# Patient Record
Sex: Female | Born: 1935 | Race: Black or African American | Hispanic: No | Marital: Single | Smoking: Former smoker
Health system: Southern US, Community
[De-identification: ages and names within clinical notes are randomized; demographics above are authoritative.]

## PROBLEM LIST (undated history)

## (undated) DIAGNOSIS — E785 Hyperlipidemia, unspecified: Secondary | ICD-10-CM

## (undated) DIAGNOSIS — Z9889 Other specified postprocedural states: Secondary | ICD-10-CM

## (undated) DIAGNOSIS — R609 Edema, unspecified: Secondary | ICD-10-CM

## (undated) DIAGNOSIS — M129 Arthropathy, unspecified: Secondary | ICD-10-CM

## (undated) DIAGNOSIS — D72819 Decreased white blood cell count, unspecified: Secondary | ICD-10-CM

## (undated) DIAGNOSIS — E871 Hypo-osmolality and hyponatremia: Secondary | ICD-10-CM

## (undated) DIAGNOSIS — R809 Proteinuria, unspecified: Secondary | ICD-10-CM

## (undated) DIAGNOSIS — I10 Essential (primary) hypertension: Secondary | ICD-10-CM

## (undated) DIAGNOSIS — I639 Cerebral infarction, unspecified: Secondary | ICD-10-CM

## (undated) DIAGNOSIS — J45901 Unspecified asthma with (acute) exacerbation: Secondary | ICD-10-CM

## (undated) DIAGNOSIS — R413 Other amnesia: Secondary | ICD-10-CM

## (undated) HISTORY — DX: Decreased white blood cell count, unspecified: D72.819

## (undated) HISTORY — DX: Hyperlipidemia, unspecified: E78.5

## (undated) HISTORY — DX: Unspecified asthma with (acute) exacerbation: J45.901

## (undated) HISTORY — DX: Edema, unspecified: R60.9

## (undated) HISTORY — DX: Arthropathy, unspecified: M12.9

## (undated) HISTORY — PX: OTHER SURGICAL HISTORY: SHX169

## (undated) HISTORY — DX: Other specified postprocedural states: Z98.890

## (undated) HISTORY — DX: Proteinuria, unspecified: R80.9

## (undated) HISTORY — DX: Essential (primary) hypertension: I10

## (undated) HISTORY — DX: Cerebral infarction, unspecified: I63.9

## (undated) HISTORY — DX: Hypo-osmolality and hyponatremia: E87.1

## (undated) HISTORY — DX: Other amnesia: R41.3

---

## 2001-06-04 ENCOUNTER — Encounter (INDEPENDENT_AMBULATORY_CARE_PROVIDER_SITE_OTHER): Payer: Self-pay | Admitting: Internal Medicine

## 2001-06-04 ENCOUNTER — Ambulatory Visit (HOSPITAL_COMMUNITY): Admission: RE | Admit: 2001-06-04 | Discharge: 2001-06-04 | Payer: Self-pay | Admitting: Internal Medicine

## 2002-06-13 ENCOUNTER — Encounter (INDEPENDENT_AMBULATORY_CARE_PROVIDER_SITE_OTHER): Payer: Self-pay | Admitting: Internal Medicine

## 2002-06-13 ENCOUNTER — Ambulatory Visit (HOSPITAL_COMMUNITY): Admission: RE | Admit: 2002-06-13 | Discharge: 2002-06-13 | Payer: Self-pay | Admitting: Internal Medicine

## 2002-06-16 ENCOUNTER — Encounter (INDEPENDENT_AMBULATORY_CARE_PROVIDER_SITE_OTHER): Payer: Self-pay | Admitting: Internal Medicine

## 2002-06-16 ENCOUNTER — Ambulatory Visit (HOSPITAL_COMMUNITY): Admission: RE | Admit: 2002-06-16 | Discharge: 2002-06-16 | Payer: Self-pay | Admitting: Internal Medicine

## 2003-07-03 ENCOUNTER — Ambulatory Visit (HOSPITAL_COMMUNITY): Admission: RE | Admit: 2003-07-03 | Discharge: 2003-07-03 | Payer: Self-pay | Admitting: Internal Medicine

## 2003-07-03 ENCOUNTER — Encounter (INDEPENDENT_AMBULATORY_CARE_PROVIDER_SITE_OTHER): Payer: Self-pay | Admitting: Internal Medicine

## 2003-07-07 ENCOUNTER — Inpatient Hospital Stay (HOSPITAL_COMMUNITY): Admission: EM | Admit: 2003-07-07 | Discharge: 2003-07-07 | Payer: Self-pay | Admitting: Emergency Medicine

## 2004-07-04 ENCOUNTER — Ambulatory Visit (HOSPITAL_COMMUNITY): Admission: RE | Admit: 2004-07-04 | Discharge: 2004-07-04 | Payer: Self-pay | Admitting: Internal Medicine

## 2004-07-24 ENCOUNTER — Ambulatory Visit (HOSPITAL_COMMUNITY): Admission: RE | Admit: 2004-07-24 | Discharge: 2004-07-24 | Payer: Self-pay | Admitting: Internal Medicine

## 2004-10-24 ENCOUNTER — Ambulatory Visit: Payer: Self-pay | Admitting: Internal Medicine

## 2004-11-11 ENCOUNTER — Ambulatory Visit: Payer: Self-pay | Admitting: Internal Medicine

## 2004-11-27 ENCOUNTER — Ambulatory Visit: Payer: Self-pay | Admitting: Internal Medicine

## 2004-12-17 ENCOUNTER — Ambulatory Visit: Payer: Self-pay | Admitting: Internal Medicine

## 2004-12-23 ENCOUNTER — Ambulatory Visit (HOSPITAL_COMMUNITY): Admission: RE | Admit: 2004-12-23 | Discharge: 2004-12-23 | Payer: Self-pay | Admitting: Internal Medicine

## 2005-01-06 ENCOUNTER — Ambulatory Visit: Payer: Self-pay | Admitting: Internal Medicine

## 2005-02-17 ENCOUNTER — Ambulatory Visit: Payer: Self-pay | Admitting: Internal Medicine

## 2005-06-11 ENCOUNTER — Ambulatory Visit: Payer: Self-pay | Admitting: Internal Medicine

## 2005-07-15 ENCOUNTER — Ambulatory Visit (HOSPITAL_COMMUNITY): Admission: RE | Admit: 2005-07-15 | Discharge: 2005-07-15 | Payer: Self-pay | Admitting: Internal Medicine

## 2005-08-01 ENCOUNTER — Ambulatory Visit: Payer: Self-pay | Admitting: Internal Medicine

## 2005-08-13 ENCOUNTER — Ambulatory Visit: Payer: Self-pay | Admitting: *Deleted

## 2005-08-19 ENCOUNTER — Ambulatory Visit: Payer: Self-pay | Admitting: *Deleted

## 2005-08-19 ENCOUNTER — Encounter (HOSPITAL_COMMUNITY): Admission: RE | Admit: 2005-08-19 | Discharge: 2005-09-18 | Payer: Self-pay | Admitting: *Deleted

## 2005-08-22 ENCOUNTER — Ambulatory Visit: Payer: Self-pay | Admitting: *Deleted

## 2005-09-03 ENCOUNTER — Ambulatory Visit: Payer: Self-pay | Admitting: *Deleted

## 2005-09-09 ENCOUNTER — Ambulatory Visit (HOSPITAL_COMMUNITY): Admission: RE | Admit: 2005-09-09 | Discharge: 2005-09-09 | Payer: Self-pay | Admitting: *Deleted

## 2005-10-08 ENCOUNTER — Ambulatory Visit: Payer: Self-pay | Admitting: *Deleted

## 2005-10-13 ENCOUNTER — Ambulatory Visit: Payer: Self-pay | Admitting: Internal Medicine

## 2006-02-08 ENCOUNTER — Emergency Department (HOSPITAL_COMMUNITY): Admission: EM | Admit: 2006-02-08 | Discharge: 2006-02-08 | Payer: Self-pay | Admitting: Emergency Medicine

## 2006-02-17 ENCOUNTER — Ambulatory Visit: Payer: Self-pay | Admitting: Internal Medicine

## 2006-04-21 ENCOUNTER — Ambulatory Visit: Payer: Self-pay | Admitting: Internal Medicine

## 2006-05-21 ENCOUNTER — Ambulatory Visit: Payer: Self-pay | Admitting: Internal Medicine

## 2006-07-16 ENCOUNTER — Ambulatory Visit (HOSPITAL_COMMUNITY): Admission: RE | Admit: 2006-07-16 | Discharge: 2006-07-16 | Payer: Self-pay | Admitting: Internal Medicine

## 2006-07-31 ENCOUNTER — Ambulatory Visit: Payer: Self-pay | Admitting: Internal Medicine

## 2006-12-09 ENCOUNTER — Ambulatory Visit: Payer: Self-pay | Admitting: Internal Medicine

## 2007-02-19 ENCOUNTER — Ambulatory Visit: Payer: Self-pay | Admitting: Internal Medicine

## 2007-05-11 ENCOUNTER — Ambulatory Visit: Payer: Self-pay | Admitting: Gastroenterology

## 2007-06-14 ENCOUNTER — Ambulatory Visit: Payer: Self-pay | Admitting: Family Medicine

## 2007-06-14 DIAGNOSIS — J45901 Unspecified asthma with (acute) exacerbation: Secondary | ICD-10-CM | POA: Insufficient documentation

## 2007-06-14 DIAGNOSIS — R609 Edema, unspecified: Secondary | ICD-10-CM

## 2007-06-14 DIAGNOSIS — R809 Proteinuria, unspecified: Secondary | ICD-10-CM | POA: Insufficient documentation

## 2007-06-14 DIAGNOSIS — I10 Essential (primary) hypertension: Secondary | ICD-10-CM

## 2007-06-14 DIAGNOSIS — M129 Arthropathy, unspecified: Secondary | ICD-10-CM | POA: Insufficient documentation

## 2007-06-16 ENCOUNTER — Encounter (INDEPENDENT_AMBULATORY_CARE_PROVIDER_SITE_OTHER): Payer: Self-pay | Admitting: Family Medicine

## 2007-06-18 LAB — CONVERTED CEMR LAB
AST: 30 units/L (ref 0–37)
Albumin: 4.4 g/dL (ref 3.5–5.2)
Alkaline Phosphatase: 114 units/L (ref 39–117)
Basophils Relative: 0 % (ref 0–1)
Eosinophils Absolute: 0 10*3/uL (ref 0.0–0.7)
Eosinophils Relative: 1 % (ref 0–5)
Glucose, Bld: 99 mg/dL (ref 70–99)
HDL: 76 mg/dL (ref >39)
Hemoglobin: 14.2 g/dL (ref 12.0–15.0)
LDL Cholesterol: 166 mg/dL — ABNORMAL HIGH (ref 0–99)
Lymphs Abs: 1.4 10*3/uL (ref 0.7–3.3)
Neutro Abs: 1.8 10*3/uL (ref 1.7–7.7)
Potassium: 3.8 meq/L (ref 3.5–5.3)
RBC: 4.43 M/uL (ref 3.87–5.11)
Sodium: 128 meq/L — ABNORMAL LOW (ref 135–145)
TSH: 1.574 microintl units/mL (ref 0.350–5.50)
Total Bilirubin: 0.9 mg/dL (ref 0.3–1.2)
Total Protein: 7.6 g/dL (ref 6.0–8.3)
Triglycerides: 95 mg/dL (ref <150)
VLDL: 19 mg/dL (ref 0–40)

## 2007-07-12 ENCOUNTER — Ambulatory Visit: Payer: Self-pay | Admitting: Family Medicine

## 2007-07-12 DIAGNOSIS — E785 Hyperlipidemia, unspecified: Secondary | ICD-10-CM

## 2007-07-12 LAB — CONVERTED CEMR LAB: LDL Goal: 160 mg/dL

## 2007-07-19 ENCOUNTER — Encounter (INDEPENDENT_AMBULATORY_CARE_PROVIDER_SITE_OTHER): Payer: Self-pay | Admitting: Family Medicine

## 2007-07-19 ENCOUNTER — Ambulatory Visit (HOSPITAL_COMMUNITY): Admission: RE | Admit: 2007-07-19 | Discharge: 2007-07-19 | Payer: Self-pay | Admitting: Gastroenterology

## 2007-07-27 ENCOUNTER — Encounter (INDEPENDENT_AMBULATORY_CARE_PROVIDER_SITE_OTHER): Payer: Self-pay | Admitting: Family Medicine

## 2007-07-28 ENCOUNTER — Ambulatory Visit (HOSPITAL_COMMUNITY): Admission: RE | Admit: 2007-07-28 | Discharge: 2007-07-28 | Payer: Self-pay | Admitting: Family Medicine

## 2007-07-29 ENCOUNTER — Telehealth (INDEPENDENT_AMBULATORY_CARE_PROVIDER_SITE_OTHER): Payer: Self-pay | Admitting: *Deleted

## 2007-08-09 ENCOUNTER — Ambulatory Visit: Payer: Self-pay | Admitting: Family Medicine

## 2007-08-09 ENCOUNTER — Telehealth (INDEPENDENT_AMBULATORY_CARE_PROVIDER_SITE_OTHER): Payer: Self-pay | Admitting: *Deleted

## 2007-08-09 DIAGNOSIS — D72819 Decreased white blood cell count, unspecified: Secondary | ICD-10-CM | POA: Insufficient documentation

## 2007-08-12 ENCOUNTER — Encounter (INDEPENDENT_AMBULATORY_CARE_PROVIDER_SITE_OTHER): Payer: Self-pay | Admitting: Family Medicine

## 2007-08-24 ENCOUNTER — Encounter (INDEPENDENT_AMBULATORY_CARE_PROVIDER_SITE_OTHER): Payer: Self-pay | Admitting: Family Medicine

## 2007-09-15 ENCOUNTER — Ambulatory Visit: Payer: Self-pay | Admitting: Family Medicine

## 2007-09-28 ENCOUNTER — Encounter (INDEPENDENT_AMBULATORY_CARE_PROVIDER_SITE_OTHER): Payer: Self-pay | Admitting: Family Medicine

## 2007-10-14 ENCOUNTER — Encounter (INDEPENDENT_AMBULATORY_CARE_PROVIDER_SITE_OTHER): Payer: Self-pay | Admitting: Family Medicine

## 2007-10-18 ENCOUNTER — Encounter (INDEPENDENT_AMBULATORY_CARE_PROVIDER_SITE_OTHER): Payer: Self-pay | Admitting: Family Medicine

## 2007-10-19 LAB — CONVERTED CEMR LAB
Alkaline Phosphatase: 103 units/L (ref 39–117)
CO2: 25 meq/L (ref 19–32)
Cholesterol: 241 mg/dL — ABNORMAL HIGH (ref 0–200)
Creatinine, Ser: 0.77 mg/dL (ref 0.40–1.20)
Glucose, Bld: 107 mg/dL — ABNORMAL HIGH (ref 70–99)
HCT: 40.7 % (ref 36.0–46.0)
HDL: 78 mg/dL (ref >39)
MCHC: 34.4 g/dL (ref 30.0–36.0)
MCV: 98.5 fL (ref 78.0–100.0)
RBC: 4.13 M/uL (ref 3.87–5.11)
Total Bilirubin: 0.9 mg/dL (ref 0.3–1.2)
Total CHOL/HDL Ratio: 3.1
Triglycerides: 98 mg/dL (ref <150)
VLDL: 20 mg/dL (ref 0–40)
WBC: 4.6 10*3/uL (ref 4.0–10.5)

## 2007-10-20 ENCOUNTER — Telehealth (INDEPENDENT_AMBULATORY_CARE_PROVIDER_SITE_OTHER): Payer: Self-pay | Admitting: *Deleted

## 2007-11-25 ENCOUNTER — Telehealth (INDEPENDENT_AMBULATORY_CARE_PROVIDER_SITE_OTHER): Payer: Self-pay | Admitting: Family Medicine

## 2007-11-26 ENCOUNTER — Ambulatory Visit: Payer: Self-pay | Admitting: Family Medicine

## 2007-11-26 ENCOUNTER — Telehealth (INDEPENDENT_AMBULATORY_CARE_PROVIDER_SITE_OTHER): Payer: Self-pay | Admitting: *Deleted

## 2007-11-26 DIAGNOSIS — R413 Other amnesia: Secondary | ICD-10-CM

## 2007-12-02 ENCOUNTER — Ambulatory Visit (HOSPITAL_COMMUNITY): Admission: RE | Admit: 2007-12-02 | Discharge: 2007-12-02 | Payer: Self-pay | Admitting: Family Medicine

## 2007-12-03 ENCOUNTER — Encounter (INDEPENDENT_AMBULATORY_CARE_PROVIDER_SITE_OTHER): Payer: Self-pay | Admitting: Family Medicine

## 2007-12-14 ENCOUNTER — Ambulatory Visit: Payer: Self-pay | Admitting: Family Medicine

## 2007-12-14 DIAGNOSIS — J301 Allergic rhinitis due to pollen: Secondary | ICD-10-CM

## 2007-12-28 ENCOUNTER — Ambulatory Visit: Payer: Self-pay | Admitting: Family Medicine

## 2007-12-28 DIAGNOSIS — M810 Age-related osteoporosis without current pathological fracture: Secondary | ICD-10-CM | POA: Insufficient documentation

## 2008-01-26 ENCOUNTER — Ambulatory Visit: Payer: Self-pay | Admitting: Family Medicine

## 2008-01-27 ENCOUNTER — Ambulatory Visit (HOSPITAL_COMMUNITY): Admission: RE | Admit: 2008-01-27 | Discharge: 2008-01-27 | Payer: Self-pay | Admitting: Family Medicine

## 2008-01-28 ENCOUNTER — Telehealth (INDEPENDENT_AMBULATORY_CARE_PROVIDER_SITE_OTHER): Payer: Self-pay | Admitting: *Deleted

## 2008-02-01 ENCOUNTER — Ambulatory Visit: Payer: Self-pay | Admitting: Family Medicine

## 2008-02-01 ENCOUNTER — Telehealth (INDEPENDENT_AMBULATORY_CARE_PROVIDER_SITE_OTHER): Payer: Self-pay | Admitting: *Deleted

## 2008-02-01 DIAGNOSIS — M1710 Unilateral primary osteoarthritis, unspecified knee: Secondary | ICD-10-CM

## 2008-02-07 ENCOUNTER — Ambulatory Visit (HOSPITAL_COMMUNITY): Admission: RE | Admit: 2008-02-07 | Discharge: 2008-02-07 | Payer: Self-pay | Admitting: Family Medicine

## 2008-02-07 ENCOUNTER — Encounter (INDEPENDENT_AMBULATORY_CARE_PROVIDER_SITE_OTHER): Payer: Self-pay | Admitting: Family Medicine

## 2008-02-08 ENCOUNTER — Telehealth (INDEPENDENT_AMBULATORY_CARE_PROVIDER_SITE_OTHER): Payer: Self-pay | Admitting: *Deleted

## 2008-02-29 ENCOUNTER — Ambulatory Visit: Payer: Self-pay | Admitting: Family Medicine

## 2008-02-29 LAB — CONVERTED CEMR LAB
Nitrite: NEGATIVE
Protein, U semiquant: >=300
Urobilinogen, UA: 0.2
pH: 7.5

## 2008-05-31 ENCOUNTER — Ambulatory Visit: Payer: Self-pay | Admitting: Family Medicine

## 2008-06-01 LAB — CONVERTED CEMR LAB
BUN: 17 mg/dL (ref 6–23)
Calcium: 9.3 mg/dL (ref 8.4–10.5)
Eosinophils Absolute: 0 10*3/uL (ref 0.0–0.7)
Eosinophils Relative: 0 % (ref 0–5)
Glucose, Bld: 84 mg/dL (ref 70–99)
HCT: 38.5 % (ref 36.0–46.0)
Lymphs Abs: 1.2 10*3/uL (ref 0.7–4.0)
MCHC: 33.8 g/dL (ref 30.0–36.0)
MCV: 98.7 fL (ref 78.0–100.0)
Platelets: 172 10*3/uL (ref 150–400)
RDW: 12.9 % (ref 11.5–15.5)
Sodium: 133 meq/L — ABNORMAL LOW (ref 135–145)
WBC: 3.7 10*3/uL — ABNORMAL LOW (ref 4.0–10.5)

## 2008-07-12 ENCOUNTER — Ambulatory Visit: Payer: Self-pay | Admitting: Family Medicine

## 2008-07-19 ENCOUNTER — Ambulatory Visit (HOSPITAL_COMMUNITY): Admission: RE | Admit: 2008-07-19 | Discharge: 2008-07-19 | Payer: Self-pay | Admitting: Family Medicine

## 2008-08-09 ENCOUNTER — Ambulatory Visit: Payer: Self-pay | Admitting: Family Medicine

## 2008-08-15 ENCOUNTER — Encounter (INDEPENDENT_AMBULATORY_CARE_PROVIDER_SITE_OTHER): Payer: Self-pay | Admitting: Family Medicine

## 2008-08-24 ENCOUNTER — Ambulatory Visit: Payer: Self-pay | Admitting: Family Medicine

## 2008-08-24 LAB — CONVERTED CEMR LAB

## 2008-11-08 ENCOUNTER — Ambulatory Visit: Payer: Self-pay | Admitting: Family Medicine

## 2008-11-08 DIAGNOSIS — L97909 Non-pressure chronic ulcer of unspecified part of unspecified lower leg with unspecified severity: Secondary | ICD-10-CM | POA: Insufficient documentation

## 2008-11-09 ENCOUNTER — Encounter (INDEPENDENT_AMBULATORY_CARE_PROVIDER_SITE_OTHER): Payer: Self-pay | Admitting: Family Medicine

## 2008-11-16 ENCOUNTER — Encounter (INDEPENDENT_AMBULATORY_CARE_PROVIDER_SITE_OTHER): Payer: Self-pay | Admitting: Family Medicine

## 2008-11-21 ENCOUNTER — Ambulatory Visit: Payer: Self-pay | Admitting: Family Medicine

## 2008-11-22 ENCOUNTER — Encounter (INDEPENDENT_AMBULATORY_CARE_PROVIDER_SITE_OTHER): Payer: Self-pay | Admitting: Family Medicine

## 2008-11-24 LAB — CONVERTED CEMR LAB
ALT: 17 U/L
AST: 29 U/L
Albumin: 4.1 g/dL
Alkaline Phosphatase: 123 U/L — ABNORMAL HIGH
BUN: 15 mg/dL
Basophils Absolute: 0 K/uL
Basophils Relative: 0 %
CO2: 23 meq/L
Calcium: 9.7 mg/dL
Chloride: 100 meq/L
Cholesterol: 238 mg/dL — ABNORMAL HIGH
Creatinine, Ser: 0.55 mg/dL
Eosinophils Absolute: 0 K/uL
Eosinophils Relative: 0 %
Glucose, Bld: 107 mg/dL — ABNORMAL HIGH
HCT: 40.6 %
HDL: 72 mg/dL
Hemoglobin: 13.2 g/dL
LDL Cholesterol: 141 mg/dL — ABNORMAL HIGH
Lymphocytes Relative: 38 %
Lymphs Abs: 2 K/uL
MCHC: 32.5 g/dL
MCV: 100.5 fL — ABNORMAL HIGH
Monocytes Absolute: 0.4 K/uL
Monocytes Relative: 7 %
Neutro Abs: 2.8 K/uL
Neutrophils Relative %: 54 %
Platelets: 233 K/uL
Potassium: 4.1 meq/L
RBC: 4.04 M/uL
RDW: 13.2 %
Sodium: 137 meq/L
TSH: 0.936 u[IU]/mL
Total Bilirubin: 0.7 mg/dL
Total CHOL/HDL Ratio: 3.3
Total Protein: 7.2 g/dL
Triglycerides: 125 mg/dL
VLDL: 25 mg/dL
WBC: 5.2 10*3/microliter

## 2008-12-05 ENCOUNTER — Ambulatory Visit: Payer: Self-pay | Admitting: Family Medicine

## 2008-12-28 ENCOUNTER — Encounter (INDEPENDENT_AMBULATORY_CARE_PROVIDER_SITE_OTHER): Payer: Self-pay | Admitting: Family Medicine

## 2009-01-16 ENCOUNTER — Ambulatory Visit: Payer: Self-pay | Admitting: Family Medicine

## 2009-01-17 LAB — CONVERTED CEMR LAB
Albumin: 3.8 g/dL (ref 3.5–5.2)
CO2: 26 meq/L (ref 19–32)
Glucose, Bld: 86 mg/dL (ref 70–99)
Potassium: 4.1 meq/L (ref 3.5–5.3)
Sodium: 136 meq/L (ref 135–145)
Total Protein: 7 g/dL (ref 6.0–8.3)

## 2009-01-25 ENCOUNTER — Encounter (INDEPENDENT_AMBULATORY_CARE_PROVIDER_SITE_OTHER): Payer: Self-pay | Admitting: Family Medicine

## 2009-02-01 ENCOUNTER — Encounter (INDEPENDENT_AMBULATORY_CARE_PROVIDER_SITE_OTHER): Payer: Self-pay | Admitting: Family Medicine

## 2009-02-27 ENCOUNTER — Ambulatory Visit: Payer: Self-pay | Admitting: Family Medicine

## 2009-02-27 LAB — CONVERTED CEMR LAB
Bilirubin Urine: NEGATIVE
Glucose, Urine, Semiquant: NEGATIVE
Specific Gravity, Urine: 1.015
pH: 7

## 2009-03-02 ENCOUNTER — Encounter (INDEPENDENT_AMBULATORY_CARE_PROVIDER_SITE_OTHER): Payer: Self-pay | Admitting: *Deleted

## 2009-03-08 LAB — CONVERTED CEMR LAB
ALT: 13 units/L (ref 0–35)
Alkaline Phosphatase: 129 units/L — ABNORMAL HIGH (ref 39–117)
LDL Cholesterol: 107 mg/dL — ABNORMAL HIGH (ref 0–99)
Sodium: 139 meq/L (ref 135–145)
Total Bilirubin: 0.6 mg/dL (ref 0.3–1.2)
Total Protein: 7.2 g/dL (ref 6.0–8.3)
Triglycerides: 82 mg/dL (ref <150)
VLDL: 16 mg/dL (ref 0–40)

## 2009-03-14 ENCOUNTER — Encounter (INDEPENDENT_AMBULATORY_CARE_PROVIDER_SITE_OTHER): Payer: Self-pay | Admitting: Family Medicine

## 2009-03-26 ENCOUNTER — Ambulatory Visit: Payer: Self-pay | Admitting: Family Medicine

## 2009-03-29 ENCOUNTER — Encounter (INDEPENDENT_AMBULATORY_CARE_PROVIDER_SITE_OTHER): Payer: Self-pay | Admitting: Family Medicine

## 2009-05-07 ENCOUNTER — Ambulatory Visit: Payer: Self-pay | Admitting: Family Medicine

## 2009-05-08 ENCOUNTER — Encounter (INDEPENDENT_AMBULATORY_CARE_PROVIDER_SITE_OTHER): Payer: Self-pay | Admitting: Family Medicine

## 2009-05-08 LAB — CONVERTED CEMR LAB
Bilirubin, Direct: 0.1 mg/dL (ref 0.0–0.3)
Indirect Bilirubin: 0.5 mg/dL (ref 0.0–0.9)
Total Protein: 6.8 g/dL (ref 6.0–8.3)

## 2009-06-19 ENCOUNTER — Ambulatory Visit: Payer: Self-pay | Admitting: Family Medicine

## 2009-06-19 DIAGNOSIS — R740 Nonspecific elevation of levels of transaminase and lactic acid dehydrogenase [LDH]: Secondary | ICD-10-CM

## 2009-06-21 LAB — CONVERTED CEMR LAB
AST: 32 units/L (ref 0–37)
Albumin: 4 g/dL (ref 3.5–5.2)
BUN: 12 mg/dL (ref 6–23)
Calcium: 9.3 mg/dL (ref 8.4–10.5)
Chloride: 102 meq/L (ref 96–112)
HDL: 67 mg/dL (ref >39)
Potassium: 3.6 meq/L (ref 3.5–5.3)
Total Protein: 7.1 g/dL (ref 6.0–8.3)

## 2009-07-20 ENCOUNTER — Ambulatory Visit (HOSPITAL_COMMUNITY): Admission: RE | Admit: 2009-07-20 | Discharge: 2009-07-20 | Payer: Self-pay | Admitting: Family Medicine

## 2009-08-13 ENCOUNTER — Inpatient Hospital Stay (HOSPITAL_COMMUNITY): Admission: EM | Admit: 2009-08-13 | Discharge: 2009-08-20 | Payer: Self-pay | Admitting: Emergency Medicine

## 2009-08-16 ENCOUNTER — Ambulatory Visit: Payer: Self-pay | Admitting: Internal Medicine

## 2009-09-18 DIAGNOSIS — J45909 Unspecified asthma, uncomplicated: Secondary | ICD-10-CM | POA: Insufficient documentation

## 2009-09-19 ENCOUNTER — Ambulatory Visit: Payer: Self-pay | Admitting: Gastroenterology

## 2009-09-19 DIAGNOSIS — Z8639 Personal history of other endocrine, nutritional and metabolic disease: Secondary | ICD-10-CM

## 2009-09-19 DIAGNOSIS — Z862 Personal history of diseases of the blood and blood-forming organs and certain disorders involving the immune mechanism: Secondary | ICD-10-CM

## 2009-10-17 LAB — CONVERTED CEMR LAB
ALT: 11 units/L (ref 0–35)
AST: 20 units/L (ref 0–37)
Albumin: 3.8 g/dL (ref 3.5–5.2)
Alkaline Phosphatase: 133 units/L — ABNORMAL HIGH (ref 39–117)
Bilirubin, Direct: 0.2 mg/dL (ref 0.0–0.3)
Indirect Bilirubin: 0.6 mg/dL (ref 0.0–0.9)
Total Bilirubin: 0.8 mg/dL (ref 0.3–1.2)
Total Protein: 7 g/dL (ref 6.0–8.3)

## 2009-12-04 ENCOUNTER — Ambulatory Visit: Payer: Self-pay | Admitting: Gastroenterology

## 2010-01-22 ENCOUNTER — Ambulatory Visit (HOSPITAL_COMMUNITY): Admission: EM | Admit: 2010-01-22 | Discharge: 2010-01-22 | Payer: Self-pay | Admitting: Emergency Medicine

## 2010-02-21 ENCOUNTER — Encounter: Payer: Self-pay | Admitting: Orthopedic Surgery

## 2010-04-03 ENCOUNTER — Ambulatory Visit: Payer: Self-pay | Admitting: Orthopedic Surgery

## 2010-04-03 DIAGNOSIS — M479 Spondylosis, unspecified: Secondary | ICD-10-CM | POA: Insufficient documentation

## 2010-04-03 DIAGNOSIS — M549 Dorsalgia, unspecified: Secondary | ICD-10-CM | POA: Insufficient documentation

## 2010-04-11 ENCOUNTER — Encounter (HOSPITAL_COMMUNITY): Admission: RE | Admit: 2010-04-11 | Discharge: 2010-05-11 | Payer: Self-pay | Admitting: Orthopedic Surgery

## 2010-05-08 ENCOUNTER — Encounter: Payer: Self-pay | Admitting: Orthopedic Surgery

## 2010-05-13 ENCOUNTER — Encounter: Payer: Self-pay | Admitting: Orthopedic Surgery

## 2010-07-23 ENCOUNTER — Ambulatory Visit (HOSPITAL_COMMUNITY): Admission: RE | Admit: 2010-07-23 | Discharge: 2010-07-23 | Payer: Self-pay | Admitting: Internal Medicine

## 2010-08-20 ENCOUNTER — Ambulatory Visit: Payer: Self-pay | Admitting: Gastroenterology

## 2010-08-22 ENCOUNTER — Telehealth (INDEPENDENT_AMBULATORY_CARE_PROVIDER_SITE_OTHER): Payer: Self-pay | Admitting: *Deleted

## 2010-09-09 ENCOUNTER — Ambulatory Visit (HOSPITAL_COMMUNITY): Admission: RE | Admit: 2010-09-09 | Payer: Self-pay | Admitting: Gastroenterology

## 2010-09-17 ENCOUNTER — Ambulatory Visit (HOSPITAL_COMMUNITY)
Admission: RE | Admit: 2010-09-17 | Discharge: 2010-09-17 | Payer: Self-pay | Source: Home / Self Care | Attending: Ophthalmology | Admitting: Ophthalmology

## 2010-10-06 DIAGNOSIS — Z9889 Other specified postprocedural states: Secondary | ICD-10-CM

## 2010-10-06 HISTORY — DX: Other specified postprocedural states: Z98.890

## 2010-10-15 ENCOUNTER — Encounter: Payer: Self-pay | Admitting: Internal Medicine

## 2010-10-27 ENCOUNTER — Encounter: Payer: Self-pay | Admitting: Family Medicine

## 2010-10-27 ENCOUNTER — Encounter: Payer: Self-pay | Admitting: Internal Medicine

## 2010-10-29 ENCOUNTER — Ambulatory Visit (HOSPITAL_COMMUNITY)
Admission: RE | Admit: 2010-10-29 | Discharge: 2010-10-29 | Payer: Self-pay | Source: Home / Self Care | Attending: Ophthalmology | Admitting: Ophthalmology

## 2010-11-03 LAB — CONVERTED CEMR LAB
Blood in Urine, dipstick: NEGATIVE
Ketones, urine, test strip: NEGATIVE
LDL Goal: 100 mg/dL
Urobilinogen, UA: 0.2
WBC Urine, dipstick: NEGATIVE

## 2010-11-05 ENCOUNTER — Ambulatory Visit (HOSPITAL_COMMUNITY)
Admission: RE | Admit: 2010-11-05 | Discharge: 2010-11-05 | Payer: Self-pay | Source: Home / Self Care | Attending: Internal Medicine | Admitting: Internal Medicine

## 2010-11-05 HISTORY — PX: COLONOSCOPY: SHX174

## 2010-11-05 NOTE — Assessment & Plan Note (Signed)
Summary: RT HIP PAIN/HAD XRAY [facility]/REF [facility],[employer] [facility]   Vital Signs:  Patient profile:   77 year old female Height:      65 inches Weight:      131 pounds Pulse rate:   68 / minute Resp:     16 per minute  Vitals Entered By: Fuller Canada MD (April 03, 2010 9:11 AM)  Visit Type:  New patient Referring Provider:  Dr. Felecia Shelling Primary Provider:  Felecia Shelling, M.D.  CC:  right hip pain.  History of Present Illness: I saw [name] in the office today for an initial visit.  She is a 77 years old woman with the complaint of:  right hip pain.    She complains of a throbbing pain, which is 7/10, and it comes and goes to curves morning in 19 months worse with walking, better with sitting, associated with numbness and tingling somewhat relieved by Tylenol. Pain came on about 4 weeks ago. It radiates down to the foot. She has some RIGHT leg weakness and some numbness in the RIGHT foot pain starts in the lower back across the buttocks down the thigh and into the leg and foot.    Xrays right hip and pelvis 01/22/10. review of these x-rays show that there is very mild arthritis in the RIGHT hip. There is no moderate arthritis as noted on x-ray.  Meds: ASA, Tramadol, Amlodipine, Lisinopril, Ramipril.    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  Review of Systems Constitutional:  Denies weight loss, weight gain, fever, chills, and fatigue. Cardiovascular:  Denies chest pain, palpitations, fainting, and murmurs. Respiratory:  Denies  short of breath, wheezing, couch, tightness, pain on inspiration, and snoring . Gastrointestinal:  Denies heartburn, nausea, vomiting, diarrhea, constipation, and blood in your stools. Genitourinary:  Denies frequency, urgency, difficulty urinating, painful urination, flank pain, and bleeding in urine. Neurologic:  Denies numbness, tingling, unsteady gait, dizziness, tremors, and seizure. Musculoskeletal:  Complains of joint pain and stiffness; denies swelling, instability, redness, heat, and muscle pain. Endocrine:  Denies excessive thirst, exessive urination, and heat or cold intolerance. Psychiatric:  Denies nervousness, depression, anxiety, and hallucinations. Skin:  Denies changes in the skin, poor healing, rash, itching, and redness. HEENT:  Denies blurred or double vision, eye pain, redness, and watering. Immunology:  Denies seasonal allergies, sinus problems, and allergic to bee stings. Hemoatologic:  Denies easy bleeding and brusing.  Physical Exam  Additional Exam:  Constitutional: vital signs see recorded values. General: normal development, nutrition, and grooming. No deformity. Body Habitus is ectomorphic CDV: Observation and palpation was normal   Lymph: palpation of the lymph nodes were normal  Skin: inspection and palpation of the skin revealed Fungal infections in the nails of the toes. Skin otherwise normal. Neuro: coordination: normal              DTR's normal              Sensation was normal   Psyche: Alert and oriented x 3. Mood was normal.  Affect: normal  MSK: Gait: normal ,  Posture is abnormal.  She does not have any groin pain with internal rotation of the RIGHT or LEFT hip. There is no hip flexion contracture. She has normal leg lengths. Normal muscle tone and strength in her lower extremities.  Both hips and knees are stable.  Just some tenderness in her lower back has a RIGHT leg. Straight leg raise abnormal. LEFT straight leg raise  normal.     Impression & Recommendations:  Problem # 1:  BACK PAIN (ICD-724.5) Assessment New  3 views of the lumbar spine show a obliquity in the pelvis with a LEFT apex. Lumbar curve. There is a slight increase in the lumbar lordosis and there is increased arthritic change at the facet joint of L4 and 5 with subluxation anteriorly of L4 on 5 grade 1. Disc spaces remain open. L3-L4 and 5.  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 Most likely in the lumbar region.  Recommend physical therapy, continue tramadol and added gabapentin 100 mg t.i.d. okay to titrate up to 3 tablets t.i.d., but I don't expect. We will need to have the dose. Patient is referred back to her primary care doctor for further treatment, no surgical treatment necessary at this plan. Patient does not do well with therapy, and gabapentin, including increased dosing. I would recommend MRI and epidural injection at L4-L5.  Her updated medication list for this problem includes:    Adult Aspirin Ec Low Strength 81 Mg Tbec (Aspirin) .Marland Kitchen... 2 once daily    Tylenol Extra Strength 500 Mg Tabs (Acetaminophen) .Marland Kitchen..Marland Kitchen Two three times a day as needed  Orders: New Patient Level III (29562) Lumbosacral Spine ,2/3 views (72100)  Problem # 2:  SPONDYLOSIS (ICD-721.90) Assessment: New  Orders: New Patient Level III (13086) Lumbosacral Spine ,2/3 views (72100)  Medications Added to Medication List This Visit: 1)  Neurontin 100 Mg Caps (Gabapentin) .Marland Kitchen.. 1 by mouth three times a day  Patient Instructions: 1)  Most patients (90%) of patients with low back pain will improve with time (2-6 weeks). Limit activity to comfort and avoid activities that increase discomfort.  Apply moist heat and/or ice to lower back and take medication as instructed for pain relief. Please read the Back Pain Handout and start Physical Therapy as directed.  2)  continue Tramadol   3)  add neurontin for radiating pain  4)  Please schedule a follow-up appointment as needed. Prescriptions: NEURONTIN 100 MG CAPS (GABAPENTIN) 1 by mouth three times a day  #90 x 5   Entered and Authorized by:   Fuller Canada MD   Signed by:   Fuller Canada MD on 04/03/2010   Method used:   Print then Give to Patient   RxID:   [id number]

## 2010-11-05 NOTE — Miscellaneous (Signed)
Summary: PT clinical evaluation  PT clinical evaluation   Imported By: Jacklynn Ganong 05/08/2010 13:22:46  _____________________________________________________________________  External Attachment:    Type:   Image     Comment:   External Document

## 2010-11-05 NOTE — Miscellaneous (Signed)
Summary: pr order  Clinical Lists Changes  Orders: Added new Referral order of Physical Therapy Referral (PT) - Signed

## 2010-11-05 NOTE — Letter (Signed)
Summary: *Orthopedic Consult Note  [facility]  [address] [place] [address]  [place], [place] [address]   Phone: [phone]  Fax: [phone]    Re:    [name] DOB:    08-06-1922   Dear:    Danae Chen you for requesting that we see the above patient for referral.  A copy of the detailed office note will be sent under separate cover, for your review.  Evaluation today is consistent with:  1)  SPONDYLOSIS (ICD-721.90) 2)  BACK PAIN (ICD-724.5)   Our recommendation is for: physical therapy. Medical management with tramadol and gabapentin. Please see enclosed note       Thank you for this opportunity to look after your patient.  Sincerely,   Terrance Mass. MD.

## 2010-11-05 NOTE — Miscellaneous (Signed)
Summary: PT progress note  PT progress note   Imported By: Jacklynn Ganong 05/14/2010 16:30:58  _____________________________________________________________________  External Attachment:    Type:   Image     Comment:   External Document  Appended Document: ELEVATED LIVE ENZYMES, COLON CANCER SCREENING REMINDER FOR 12 MON OPV IS IN THE COMPUTER  Appended Document: Orders Update    Clinical Lists Changes  Orders: Added new Service order of Est. Patient Level III ([address]) - Signed

## 2010-11-05 NOTE — Assessment & Plan Note (Signed)
Summary: ELEVATED LIVER ENZYNES-alk phos   Visit Type:  Follow-up Visit Primary Care Provider:  Felecia Shelling, M.D.  Chief Complaint:  F/U elevated LFT"s.  History of Present Illness: Pt has had an elevated alk phos (1000)  since 1982. Workup included attempted ERCP & TH Cholangiography. ESR/AMA NEG.  LIVER Bx: portal inflammation. 1996: repeat LIVER Bx-nonspecific portal changes, copper stains neg. Transaminases are normal except with infection.  In 2002, weight 157 lbs, alk phos 153, ast 26, alt 14. 2003: On and off Lipitor-CPK elevated, 359, alk phos 181, aldolase 12.5. off Lipitor: aldolase and ast/alt returned to nl, alk phos 128. 2004 tried on Zetia. zetia d/c ? Lipitor restarted 2006. 2007: alk phos 153, ast 34, alt 23. Pt admitted 2010 with PNA on Lipitor 40 mg qd. LIVER ENZYMES ELEVATED: T BILI 2.6, D BILI 1.2, ALK PHOS 214, AST 34, ALT 36, ALBUMIN 1.9.   Arthritis is bothering her. Sees Dr. Felecia Shelling on MON. No yellow eyes or abd pain. Appetite is good.  Current Medications (verified): 1)  Adult Aspirin Ec Low Strength 81 Mg  Tbec (Aspirin) .... 2 Once Daily 2)  Amlodipine Besylate 10 Mg  Tabs (Amlodipine Besylate) .... One Daily 3)  Altace 10 Mg  Caps (Ramipril) .... Once Daily 4)  Bystolic 20 Mg Tabs (Nebivolol Hcl) .... One Daily 5)  Actonel 150 Mg Tabs (Risedronate Sodium) .... One Monthly 6)  Caltrate 600+d Plus 600-400 Mg-Unit  Tabs (Calcium Carbonate-Vit D-Min) .... Two Times A Day 7)  Tylenol Extra Strength 500 Mg Tabs (Acetaminophen) .... Two Three Times A Day As Needed 8)  Furosemide 40 Mg Tabs (Furosemide) .... Once Daily As Needed 9)  Pravachol 80 Mg Tabs (Pravastatin Sodium) .... One Daily 10)  Albuterol Sulfate (2.5 Mg/65ml) 0.083% Nebu (Albuterol Sulfate) .... Three Times A Day As Needed 11)  Multivitamins  Tabs (Multiple Vitamin) .... Once Daily 12)  Hydrochlorothiazide 12.5 Mg Caps (Hydrochlorothiazide) .... Once Daily 13)  Meclizine Hcl 25 Mg Tabs (Meclizine Hcl) .... Take  One Every 6 Hours As Needed 14)  Fish Oil  Oil (Fish Oil) .... Once Daily 15)  Klor-Con 20 Meq Pack (Potassium Chloride) .... 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  Vital Signs:  Patient profile:   77 year old female Height:      66 inches Weight:      140 pounds BSA:     1.72 Temp:     98.0 degrees F oral Pulse rate:   72 / minute BP sitting:   144 / 90  (left arm) Cuff size:   regular  Vitals Entered By: Cloria Spring LPN (December 05, 3242 9:47 AM)  Physical Exam  General:  Well developed, well nourished, no acute distress. Head:  Normocephalic and atraumatic. Lungs:  Clear throughout to auscultation. Heart:  Regular rate and rhythm; no murmurs Abdomen:  Soft, nontender and nondistended. Normal bowel sounds.  Impression & Recommendations:  Problem # 1:  LIVER FUNCTION TESTS, ABNORMAL, HX OF (ICD-V12.2) Assessment Improved  No S/Sx of cholestasis. OPV in 6 mos. Recheck  HFP. Avoid HMG CoA reductase inhibitors.  CC: PCP  Orders: Est. Patient Level II (04540)

## 2010-11-05 NOTE — Progress Notes (Signed)
  Phone Note Call from Patient   Reason for Call: Referral Summary of Call: Pt called to [facility] her TCS. She is having cataract surgery first and wants to wait until that is done first. You can reach her at [phone] Initial call taken by: Diana Eves,  August 22, 2010 1:12 PM     Appended Document:  I called pt,no answer.

## 2010-11-05 NOTE — Letter (Signed)
Summary: History form  History form   Imported By: Jacklynn Ganong 04/04/2010 15:04:31  _____________________________________________________________________  External Attachment:    Type:   Image     Comment:   External Document

## 2010-11-05 NOTE — Letter (Signed)
Summary: TCS ORDER  TCS ORDER   Imported By: Ave Filter 08/20/2010 09:47:22  _____________________________________________________________________  External Attachment:    Type:   Image     Comment:   External Document

## 2010-11-06 NOTE — Op Note (Signed)
  [name], [name]             ACCOUNT NO.:  [id number]  MEDICAL RECORD NO.:  [id number]          PATIENT TYPE:  AMB  LOCATION:  DAY                           FACILITY:  [facility]  PHYSICIAN:  R. Roetta Sessions, M.D. DATE OF BIRTH:  Aug 15, 1922  DATE OF PROCEDURE:  11/05/2010 DATE OF DISCHARGE:                              OPERATIVE REPORT   PROCEDURE:  Screening colonoscopy.  INDICATIONS FOR PROCEDURE:  An 77 year old lady who is followed by Dr. Felecia Shelling, primary, comes for her first ever screening colonoscopy.  She has no lower GI tract symptoms currently.  No family history of polyps or colon cancer.  Colonoscopy is now being done.  Risks, benefits, limitations, alternatives, and imponderables have been discussed, questions answered.  Please see the documentation in the medical record.  PROCEDURE NOTE:  O2 saturation, blood pressure, pulse, respirations were monitored throughout the entire procedure.  CONSCIOUS SEDATION:  Versed 4 mg IV, Demerol 50 mg IV in divided doses.  INSTRUMENT:  Pentax video chip system.  FINDINGS:  Digital rectal exam revealed moderate degree of anal stenosis which limits the digital examination.  Endoscopic findings:  Prep was good.  Colon:  Colonic mucosa was surveyed from the rectosigmoid junction through the left transverse right colon to the appendiceal orifice, ileocecal valve/cecum.  These structures were well seen and photographed for the record.  From this level, the scope was slowly and cautiously withdrawn.  All previously mentioned mucosal surfaces were again seen.  The patient at the clustering, had diffusely pigmented rectal colonic mucosa consistent with melanosis coli.  The patient had clustering of cecal diverticula.  Remainder of the colonic mucosa otherwise appeared normal.  Scope was pulled down to the rectum where a thorough examination of rectal mucosa including retroflexed view of the anal verge demonstrated some anal papilla and  internal hemorrhoids, some excoriation likely related to scope and enema-tip trauma.  The patient tolerated the procedure well.  Cecal withdrawal time 7 minutes.  IMPRESSION: 1. Anal papilla and internal hemorrhoids pigmented rectal mucosa     consistent with melanosis coli. 2. Diffuse pigmentation of the colonic mucosa consistent with     melanosis coli, localized cecal diverticula as described above.  RECOMMENDATIONS: 1. Patient is to follow up with Dr. Felecia Shelling, as scheduled. 2. No further GI evaluation warranted at this time.     Jonathon Bellows, M.D.     RMR/MEDQ  D:  11/05/2010  T:  11/05/2010  Job:  [id number]  cc:   Tesfaye D. Felecia Shelling, MD Fax: [phone]  Electronically Signed by Lorrin Goodell M.D. on 11/06/2010 02:22:42 PM

## 2010-11-07 NOTE — Letter (Signed)
Summary: TRIAGE ORDER  TRIAGE ORDER   Imported By: Ave Filter 10/15/2010 11:51:21  _____________________________________________________________________  External Attachment:    Type:   Image     Comment:   External Document  Appended Document: TRIAGE ORDER ok as is

## 2010-11-07 NOTE — Assessment & Plan Note (Signed)
Summary: ELEVATED LIVE ENZYMES, COLON CANCER SCREENING   Visit Type:  Follow-up Visit Primary Care Provider:  Felecia Shelling, M.D.  Chief Complaint:  follow up- doing good.  History of Present Illness: Doing good except her right leg hurts her. Seeing PT at [facility] and doing better than PT at [facility]. Lortab helps her pain. No nausea or vomiting. Lost 4 lbs since last visit. BM every AM. No abd pain. Appetite: good.   Current Medications (verified): 1)  Adult Aspirin Ec Low Strength 81 Mg  Tbec (Aspirin) .... 2 Once Daily 2)  Amlodipine Besylate 10 Mg  Tabs (Amlodipine Besylate) .... One Daily 3)  Altace 10 Mg  Caps (Ramipril) .... Once Daily 4)  Caltrate 600+d Plus 600-400 Mg-Unit  Tabs (Calcium Carbonate-Vit D-Min) .... Two Times A Day 5)  Albuterol Sulfate (2.5 Mg/34ml) 0.083% Nebu (Albuterol Sulfate) .... Three Times A Day As Needed 6)  Multivitamins  Tabs (Multiple Vitamin) .... Once Daily 7)  Fish Oil  Oil (Fish Oil) .... Once Daily 8)  Klor-Con 20 Meq Pack (Potassium Chloride) .... Once Daily 9)  Tramadol-Acetaminophen 37.5-325 Mg Tabs (Tramadol-Acetaminophen) .... Three Times A Day 10)  Lortab 5-500 Mg Tabs (Hydrocodone-Acetaminophen) .... Two Times A Day As Needed 11)  Lisinopril-Hydrochlorothiazide 20-12.5 Mg Tabs (Lisinopril-Hydrochlorothiazide) .... Two Times A Day  Allergies (verified): No Known Drug Allergies  Past History:  Past Medical History: Last updated: 12/14/2007 Current Problems:  MEMORY LOSS (ICD-780.93) LEUKOPENIA, MILD (ICD-288.50) HYPONATREMIA (ICD-276.1) HYPERLIPIDEMIA (ICD-272.4) PROTEINURIA (ICD-791.0) PERIPHERAL EDEMA (ICD-782.3) Hx of ASTHMA NOS W/ACUTE EXACERBATION (ICD-493.92) ARTHRITIS (ICD-716.90) HYPERTENSION (ICD-401.9)  Past Surgical History: Last updated: 08/09/2008 No hx of surgery.  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  Social History: Reviewed history from 04/03/2010 and no changes required. Single, no children Never Smoked Alcohol use-no Drug use-no no caffeine Retired Clinical research associate alone Highest level of education - high school and one year at [employer] in [place].  Vital Signs:  Patient profile:   77 year old female Height:      65 inches Weight:      136 pounds BMI:     22.71 Temp:     97.9 degrees F oral Pulse rate:   80 / minute BP sitting:   132 / 78  (left arm) Cuff size:   regular  Vitals Entered By: Hendricks Limes LPN (August 20, 2010 9:01 AM)  Physical Exam  General:  Well developed, well nourished, no acute distress. Head:  Normocephalic and atraumatic. Eyes:  PERRL, no icterus. Lungs:  Clear throughout to auscultation. Heart:  Regular rate and rhythm; no murmurs. Abdomen:  Soft, nontender and nondistended. Normal bowel sounds. Extremities:  No edema noted. Neurologic:  Alert and  oriented x4;  grossly normal neurologically.  Impression & Recommendations:  Problem # 1:  LIVER FUNCTION TESTS, ABNORMAL, HX OF (ICD-V12.2) Assessment Improved  CHECK HFP. OPV IN 12 MOS.  Orders: T-Hepatic Function [phone])  Problem # 2:  SCREENING, COLON CANCER (ICD-V76.51) Assessment: New  Never had a TCS. Scheduled in 2008 but did not have it done. TCS NOV 2011-SUPREP.  CC: PCP  Orders: T-Hepatic Function [phone])  Appended Document: ELEVATED LIVE ENZYMES, COLON CANCER SCREENING REMINDER FOR 12 MON OPV IS IN THE COMPUTER  Appended Document: Orders Update    Clinical Lists Changes  Orders: Added new Service order of Est.  Patient Level III ([address]) - Signed

## 2010-12-16 LAB — POCT I-STAT 4, (NA,K, GLUC, HGB,HCT): Sodium: 130 mEq/L — ABNORMAL LOW (ref 135–145)

## 2011-01-08 LAB — DIFFERENTIAL
Basophils Absolute: 0 10*3/uL (ref 0.0–0.1)
Basophils Absolute: 0 10*3/uL (ref 0.0–0.1)
Basophils Absolute: 0 10*3/uL (ref 0.0–0.1)
Basophils Absolute: 0 10*3/uL (ref 0.0–0.1)
Basophils Absolute: 0 10*3/uL (ref 0.0–0.1)
Basophils Relative: 0 % (ref 0–1)
Basophils Relative: 0 % (ref 0–1)
Eosinophils Absolute: 0 10*3/uL (ref 0.0–0.7)
Eosinophils Absolute: 0 10*3/uL (ref 0.0–0.7)
Eosinophils Relative: 0 % (ref 0–5)
Eosinophils Relative: 0 % (ref 0–5)
Eosinophils Relative: 1 % (ref 0–5)
Lymphocytes Relative: 2 % — ABNORMAL LOW (ref 12–46)
Lymphocytes Relative: 5 % — ABNORMAL LOW (ref 12–46)
Lymphocytes Relative: 5 % — ABNORMAL LOW (ref 12–46)
Lymphocytes Relative: 6 % — ABNORMAL LOW (ref 12–46)
Lymphs Abs: 0.4 10*3/uL — ABNORMAL LOW (ref 0.7–4.0)
Lymphs Abs: 1 10*3/uL (ref 0.7–4.0)
Lymphs Abs: 1.1 10*3/uL (ref 0.7–4.0)
Monocytes Absolute: 1 10*3/uL (ref 0.1–1.0)
Monocytes Absolute: 1 10*3/uL (ref 0.1–1.0)
Monocytes Relative: 4 % (ref 3–12)
Neutro Abs: 16.2 10*3/uL — ABNORMAL HIGH (ref 1.7–7.7)
Neutro Abs: 18 10*3/uL — ABNORMAL HIGH (ref 1.7–7.7)
Neutro Abs: 18.8 10*3/uL — ABNORMAL HIGH (ref 1.7–7.7)
Neutro Abs: 20.5 10*3/uL — ABNORMAL HIGH (ref 1.7–7.7)
WBC Morphology: INCREASED
WBC Morphology: INCREASED

## 2011-01-08 LAB — HEPATIC FUNCTION PANEL
ALT: 24 U/L (ref 0–35)
ALT: 38 U/L — ABNORMAL HIGH (ref 0–35)
AST: 28 U/L (ref 0–37)
AST: 39 U/L — ABNORMAL HIGH (ref 0–37)
AST: 52 U/L — ABNORMAL HIGH (ref 0–37)
Albumin: 1.9 g/dL — ABNORMAL LOW (ref 3.5–5.2)
Albumin: 1.9 g/dL — ABNORMAL LOW (ref 3.5–5.2)
Alkaline Phosphatase: 214 U/L — ABNORMAL HIGH (ref 39–117)
Bilirubin, Direct: 1.2 mg/dL — ABNORMAL HIGH (ref 0.0–0.3)
Bilirubin, Direct: 1.3 mg/dL — ABNORMAL HIGH (ref 0.0–0.3)
Indirect Bilirubin: 0.8 mg/dL (ref 0.3–0.9)
Indirect Bilirubin: 1.4 mg/dL — ABNORMAL HIGH (ref 0.3–0.9)
Total Bilirubin: 2.1 mg/dL — ABNORMAL HIGH (ref 0.3–1.2)
Total Protein: 5.7 g/dL — ABNORMAL LOW (ref 6.0–8.3)
Total Protein: 6.1 g/dL (ref 6.0–8.3)
Total Protein: 6.2 g/dL (ref 6.0–8.3)

## 2011-01-08 LAB — URINALYSIS, MICROSCOPIC ONLY
Ketones, ur: NEGATIVE mg/dL
Leukocytes, UA: NEGATIVE
Nitrite: NEGATIVE
Protein, ur: 30 mg/dL — AB
Urobilinogen, UA: 0.2 mg/dL (ref 0.0–1.0)
pH: 6.5 (ref 5.0–8.0)

## 2011-01-08 LAB — CBC
HCT: 33.8 % — ABNORMAL LOW (ref 36.0–46.0)
HCT: 35.8 % — ABNORMAL LOW (ref 36.0–46.0)
HCT: 36.6 % (ref 36.0–46.0)
Hemoglobin: 12.5 g/dL (ref 12.0–15.0)
Hemoglobin: 12.6 g/dL (ref 12.0–15.0)
Hemoglobin: 13.4 g/dL (ref 12.0–15.0)
MCHC: 34.3 g/dL (ref 30.0–36.0)
MCHC: 35.1 g/dL (ref 30.0–36.0)
MCV: 94.3 fL (ref 78.0–100.0)
MCV: 95.1 fL (ref 78.0–100.0)
Platelets: 123 10*3/uL — ABNORMAL LOW (ref 150–400)
Platelets: 169 10*3/uL (ref 150–400)
Platelets: 73 10*3/uL — ABNORMAL LOW (ref 150–400)
Platelets: 80 10*3/uL — ABNORMAL LOW (ref 150–400)
RBC: 4.07 MIL/uL (ref 3.87–5.11)
RDW: 14 % (ref 11.5–15.5)
RDW: 14.1 % (ref 11.5–15.5)
RDW: 14.2 % (ref 11.5–15.5)
RDW: 14.2 % (ref 11.5–15.5)
RDW: 14.2 % (ref 11.5–15.5)
RDW: 14.4 % (ref 11.5–15.5)
WBC: 17.3 10*3/uL — ABNORMAL HIGH (ref 4.0–10.5)
WBC: 21.1 10*3/uL — ABNORMAL HIGH (ref 4.0–10.5)

## 2011-01-08 LAB — BASIC METABOLIC PANEL
BUN: 26 mg/dL — ABNORMAL HIGH (ref 6–23)
BUN: 31 mg/dL — ABNORMAL HIGH (ref 6–23)
BUN: 41 mg/dL — ABNORMAL HIGH (ref 6–23)
BUN: 56 mg/dL — ABNORMAL HIGH (ref 6–23)
BUN: 63 mg/dL — ABNORMAL HIGH (ref 6–23)
CO2: 25 mEq/L (ref 19–32)
CO2: 25 mEq/L (ref 19–32)
Calcium: 10.3 mg/dL (ref 8.4–10.5)
Calcium: 9 mg/dL (ref 8.4–10.5)
Calcium: 9.7 mg/dL (ref 8.4–10.5)
Chloride: 102 mEq/L (ref 96–112)
Chloride: 99 mEq/L (ref 96–112)
Creatinine, Ser: 1.89 mg/dL — ABNORMAL HIGH (ref 0.4–1.2)
GFR calc non Af Amer: 25 mL/min — ABNORMAL LOW (ref >60)
GFR calc non Af Amer: 31 mL/min — ABNORMAL LOW (ref >60)
GFR calc non Af Amer: 41 mL/min — ABNORMAL LOW (ref >60)
GFR calc non Af Amer: 44 mL/min — ABNORMAL LOW (ref >60)
Glucose, Bld: 123 mg/dL — ABNORMAL HIGH (ref 70–99)
Glucose, Bld: 124 mg/dL — ABNORMAL HIGH (ref 70–99)
Glucose, Bld: 130 mg/dL — ABNORMAL HIGH (ref 70–99)
Glucose, Bld: 131 mg/dL — ABNORMAL HIGH (ref 70–99)
Glucose, Bld: 138 mg/dL — ABNORMAL HIGH (ref 70–99)
Potassium: 3.2 mEq/L — ABNORMAL LOW (ref 3.5–5.1)
Potassium: 3.3 mEq/L — ABNORMAL LOW (ref 3.5–5.1)
Potassium: 3.8 mEq/L (ref 3.5–5.1)
Sodium: 133 mEq/L — ABNORMAL LOW (ref 135–145)
Sodium: 133 mEq/L — ABNORMAL LOW (ref 135–145)
Sodium: 135 mEq/L (ref 135–145)

## 2011-01-08 LAB — POCT CARDIAC MARKERS
CKMB, poc: 6.5 ng/mL (ref 1.0–8.0)
Myoglobin, poc: >500 ng/mL (ref 12–200)
Troponin i, poc: <0.05 ng/mL (ref 0.00–0.09)

## 2011-01-08 LAB — CULTURE, BLOOD (ROUTINE X 2)

## 2011-01-08 LAB — COMPREHENSIVE METABOLIC PANEL
ALT: 29 U/L (ref 0–35)
AST: 38 U/L — ABNORMAL HIGH (ref 0–37)
Alkaline Phosphatase: 229 U/L — ABNORMAL HIGH (ref 39–117)
CO2: 26 mEq/L (ref 19–32)
GFR calc Af Amer: 28 mL/min — ABNORMAL LOW (ref >60)
Glucose, Bld: 107 mg/dL — ABNORMAL HIGH (ref 70–99)
Potassium: 3.1 mEq/L — ABNORMAL LOW (ref 3.5–5.1)
Sodium: 133 mEq/L — ABNORMAL LOW (ref 135–145)
Total Protein: 7 g/dL (ref 6.0–8.3)

## 2011-01-08 LAB — URINE MICROSCOPIC-ADD ON

## 2011-01-08 LAB — URINALYSIS, ROUTINE W REFLEX MICROSCOPIC
Nitrite: POSITIVE — AB
Specific Gravity, Urine: 1.02 (ref 1.005–1.030)
pH: 5.5 (ref 5.0–8.0)

## 2011-01-08 LAB — URINE CULTURE: Colony Count: >=100000

## 2011-02-18 NOTE — Assessment & Plan Note (Signed)
NAMEMarland [name]  DEMITRA, DANLEY              CHART#:  [id number]   DATE:  05/11/2007                       DOB:  [birth year]   PROBLEM LIST:  1. Hyperlipidemia.  2. Asthma.  3. Hypertension.  4. Lower extremity edema.   SUBJECTIVE:  [name] is an 77 year old female who is seen as a return  patient visit.  She is a new patient to me.  She was originally seen by  Dr. Karilyn Cota.  She was placed on Lipitor for hyperlipidemia and developed  elevated creatinine kinase.  She complains of swelling in her legs.  The  Lipitor has been stopped.  Her last lab draw in July 2008, her CK was  down to 278.  She denies any change in her bowel movements, blood in her  stool, or black, tarry stool.  She has no heartburn or indigestion.  She  complains that her legs swell on a daily basis.  After she goes to bed  and wakes up in the morning, it is down.  She walks 3 miles a day  without any chest pain or shortness of breath.  The swelling does not  prevent her from walking.  She has the swelling in her legs about every  day.   MEDICATIONS:  1. Albuterol inhaler 3-4 times a week.  2. Lasix once daily as needed.  3. Hydrochlorothiazide 12.5 mg daily.   OBJECTIVE:  VITAL SIGNS:  Weight 146 pounds (down 3 pounds since May  2008), height 5 feet 6 inches, temperature 97.8, blood pressure 160/88,  pulse 68.  GENERAL:  She is in no apparent distress.  Alert and oriented  x4.HEENT:  Atraumatic, normocephalic.  Pupils equal and reactive to  light.  Mouth:  No oral lesions.  Posterior oropharynx without erythema  or exudate.NECK:  Full range of motion.  No lymphadenopathy. LUNGS:  Clear to auscultation bilaterally.CARDIOVASCULAR:  Regular rhythm.  No  murmur.  Normal S1 and S2.ABDOMEN:  Bowel sounds are present, soft,  nontender, nondistended.  No rebound or guarding.EXTREMITIES:  Without  cyanosis, clubbing or edema. NEURO:  She has no focal neurologic  deficit.   ASSESSMENT:  [name] is an 77 year old female who  has no active GI  issues.  She is at average risk for developing colon cancer and has  never had a colonoscopy.  She is a highly functioning octogenarian and  would benefit from age appropriate screening.  She also has leg edema  which is likely venous insufficiency, but the differential diagnoses  include thyroid disease or a protein losing nephropathy.   PLAN:  1. She should take Lasix every other day.  She is scheduled for a      basic metabolic panel in 2 weeks.  2. Today, she will have a hepatic function panel, TSH and urinalysis.  3. She will be scheduled for a colonoscopy in September with a movie      prep.  4. She has a followup visit to see me in 6 months.  5. When I call her in regards to her labs, then I will make the      referral for a primary care physician so that she may have her      blood pressure more appropriately managed.       Kassie Mends, M.D.  Electronically Signed     SM/MEDQ  D:  05/11/2007  T:  05/12/2007  Job:  [id number]

## 2011-02-18 NOTE — Assessment & Plan Note (Signed)
NAME:  [name], [name]              CHART#:  [id number]   DATE:  02/19/2007                       DOB:  [birth year]   PRESENTING COMPLAINT:  Here to review the results of bone density and  initiate therapy, and also to review results of recent blood work.   SUBJECTIVE:  [name] is an 77 year old very active African American  female who is here for scheduled visit.  She was last seen about 10  weeks ago.  She had these studies, I was not able to reach her via  phone.  Felt that she needed to come back to review the results and also  initiate therapy, etc.  She continues to feel well.  She works full  time.  She walks 2-3 miles every morning, and she does do light  weightbearing exercises.  She denies chest pain, dyspnea.  She remains  with good appetite and she has no bowel or urinary complaints.  She also  denies myalgias.   MEDICATIONS:  1. Lipitor 10 mg daily.  2. Albuterol inhaler 2 puffs b.i.d. p.r.n.  3. MVI daily.  4. Calcium with vitamin D one daily.  5. Altace 10 mg daily.  6. Lasix 20 mg daily.  7. HCTZ 12.5 mg daily p.r.n.  On these days she does not take Lasix.  8. Kay-Ciel 20 mEq daily.  9. Norvasc 10 mg daily.  10.Fish oil one capsule daily.  11.Meclizine 25 mg q. 6 p.r.n.  12.Tylenol p.r.n.  13.Aleve OTC b.i.d. p.r.n., but she does not take it every day.   OBJECTIVE:  VITAL SIGNS:  Weight 149 pounds which is stable.  She is 65-  1/2 inches tall, pulse 76 per minute, blood pressure 152/92, temperature  is 98.1.  HEENT:  Conjunctivae is pink, sclerae is nonicteric.  JVD is normal.  LUNGS:  Clear to auscultation.  EXTREMITIES:  She does not have clubbing, she has trace edema around her  ankles.   STUDIES:  Bone density done December 29, 2005, reveals mild to moderate  osteoporosis with T-score of -2.64.  These changes are seen at L2;  however, she has osteopenia involving her hip, femoral neck, and rest of  the lumbar vertebrae.   Labs from January 20, 2007:   WBC is 3.3, H&H is 14.4 and 41.6, platelet  count is 239 K.  She has 39% neutrophils and 50% lymphocytes.  Sodium is  130, potassium 4.3, chloride 91, CO2 is 28, glucose 98, BUN 14,  creatinine 0.63, alkaline phosphatase 161, bilirubin is 0.8, AST 33, ALT  17, albumin is 4.4, and CPK is 582.   ASSESSMENT:  1. Osteoporosis/osteopenia.  Luckily, she has never had any fractures,      but she needs to be treated to prevent future problems.  2. Elevated CPK.  This is felt to be secondary to Lipitor.  Luckily,      she is asymptomatic.  3. Mildly elevated alkaline phosphatase which she has had for years      off and on.  Previous evaluation has ruled out cholestatic liver      disease.  4. Mild hyponatremia, secondary to diuretic therapy.  5. Mild leukopenia, probably a normal variant.  Although, she has had      normal WBC counts previously.   PLAN:  1. Patient advised to discontinue Lipitor.  2. She  will increase Calcium and Vitamin D to 3 pills a day.  She will      continue light weightbearing exercise and walking like she is      doing.  3. Actonel 35 mg p.o. q. weekly, samples given along with the      prescription for 4 doses at 11 refills.  4. She will have a CBC, met-7, and CPK in 8 weeks from now.       Lionel December, M.D.  Electronically Signed     NR/MEDQ  D:  02/19/2007  T:  02/19/2007  Job:  [id number]

## 2011-02-21 NOTE — H&P (Signed)
NAME:  [name], [name]                       ACCOUNT NO.:  [id number]   MEDICAL RECORD NO.:  [id number]                   PATIENT TYPE:  INP   LOCATION:  A217                                 FACILITY:  [facility]   PHYSICIAN:  Lionel December, M.D.                 DATE OF BIRTH:  1921-11-26   DATE OF ADMISSION:  07/06/2003  DATE OF DISCHARGE:  07/07/2003                                HISTORY & PHYSICAL   CHIEF COMPLAINT:  Hypotension.   HISTORY OF PRESENT ILLNESS:  [name] is a 77 year old African  American female with hypotensive episode last night, status post bleeding  varicosity of the left medial malleolus with suture placement x 2 in the ER.  The patient states she hit ankle and began to bleed profusely last p.m.  approximately 25 minutes until she was seen in the ER.  While in the ER, she  became pale, diaphoretic, nauseated, unresponsive and was given normal  saline IV and returned to normal level of consciousness shortly thereafter.  She reports feeling well prior to this incident.  She denies any melena,  hematochezia, or hemorrhoids.  She reports daily Aleve two tablets b.i.d.  approximately every four to five days a week for the past year for  osteoarthritis in her right knee.  She denies any history of peptic ulcer  disease, chest pain, shortness of breath or palpitations.  She has no  history of screening colonoscopy despite several recommendations from Dr.  Karilyn Cota.   PAST MEDICAL HISTORY:  Includes:  1. Hypertension.  2. Hyperlipidemia.  3. Osteoarthritis, right knee.   Upon admission labs revealed normocytic anemia with hemoglobin of 9.8,  hematocrit of 28.9.  Stools guaiac negative in ER.  Comprehensive metabolic  panel revealed mild hypokalemia (3.0) and hyponatremia (131) as well as  elevated non-fasting glucose.  She was given KCl 40 mEq p.o. and the severe  hypokalemia has corrected to 3.7 today.   MEDICATIONS PRIOR TO ADMISSION:  1. Zetia 10 mg  daily.  2. Altace 5 mg daily.  3. Aleve two tablets b.i.d. p.r.n. arthritic pains.  4. Ex-Lax p.r.n..  5. Multivitamin daily.  6. Albuterol q.i.d. p.r.n.  7. Hydrochlorothiazide 12.5 mg daily.   ALLERGIES:  No known drug allergies.   FAMILY HISTORY:  Mother and father both are deceased of unknown etiology.  She has a strong family history of diabetes mellitus, coronary artery  disease and MIs.  She denies any history of colorectal carcinoma, liver or  chronic GI problems however, she is a poor family historian.   SOCIAL HISTORY:  She denies any current tobacco use; however, she did smoke  for approximately 20+ years, but quit approximately 20 years ago.  She  denies any alcohol or drug use.  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  She  lives alone and has never been married.   REVIEW OF SYSTEMS:  CONSTITUTIONAL:  She reports a  stable weight, appetite  and energy level.  CARDIOPULMONARY:  She denies any chest pain, palpitations  or shortness of breath.  GI:  See HPI.  GU:  She denies any dysuria,  hematuria, or increased frequency.  SKIN:  She denies any pain or soreness  to her left lower extremity .   PHYSICAL EXAMINATION:  VITAL SIGNS:  Temp 97.5, pulse 79, respirations 18,  blood pressure 156/78, weight 145 pounds, height 66 inches.  GENERAL:  [name] is a 77 year old African American female who  appears her stated age.  She is alert and oriented, pleasant and  cooperative.  SKIN:  Warm and dry and intact except for a lesion to the left medial  malleolus with #2 sutures intact.  There is no erythema, warmth or exudate.  HEENT:  Sclera clear, nonicteric.  Conjunctivae pink.  Oropharynx pink and  moist without any lesions.  Teeth in good repair.  NECK:  Supple without thyromegaly or mass.  CHEST:  Heart regular rate and rhythm.  Apical pulse 90 without murmurs,  clicks, rubs or gallops.  EKG reveals normal sinus rhythm with a rate of 80  without any ectopy.  LUNGS:   Clear to auscultation bilaterally.  ABDOMEN:  Positive bowel sounds x 4.  No bruits.  Soft, nontender,  nondistended.  No organomegaly.  EXTREMITIES:  Pedal pulses 2+ bilaterally.  No pedal edema.   LABS:  WBCs 5.1, hemoglobin 9.8, hematocrit 28.9, platelets 227.  Sodium  135, potassium 3.7, chloride 101, CO2 29, BUN 16, creatinine 0.6, glucose  133, calcium 8.5. Total bilirubin 0.4, alkaline phosphatase 115, SGOT 31,  SGPT 20, total protein 5.3, albumin 2.8.   ASSESSMENT/PLAN:  1. Hypotension.  This is now resolved at the present.  [name] is slightly     hypertensive, however, she obviously has not received her daily dose of     anti-hypertensive.  Hypotension was probably due to vasovagal response     secondary to trauma, suture repair and bleeding.  She denies any cardiac     symptoms and she has no cardiac history except for her hyperlipidemia and     hypertension.  Electrocardiogram reveals a normal sinus rhythm in the     80s.     a. We will continue to monitor vital signs.     b. We will change intravenous to Hep-Lock.     c. We will allow her out of bed to ambulate.  2. Normocytic anemia.  Hemoglobin and hematocrit are stable today.  Mean     cell volume 92.9.  Hemoglobin is down from 13.8, hematocrit 40 on May 16, 2003 office visit.  I doubt the bleeding varicosity has caused a 4     gram drop in her hemoglobin.     a. We will obtain an anemia profile.     b. She was instructed to decrease her Aleve use to one tablet daily up to        two tablets daily at most.     c. We will recommend a daily proton pump inhibitor.  We will start her on        Prilosec 20 mg daily.  3. Left lower extremity  wound secondary to bleeding varicosity.  Currently     is stable sutured with two sutures intact.  There are no signs or     symptoms of infection.     a. Daily sterile dressing changes.    b.  Neosporin apply to affected area every day.     c. Appointment made for suture  removal and followup with Dr. Karilyn Cota on        July 13, 2003.     d. Watch for signs and symptoms of infection.  4. Hypokalemia.  This is currently corrected.  We will continue to monitor.  5. Elevated non fasting glucose, possibly hyperglycemia, consider diabetes     mellitus new onset as non fasting glucoses have been greater than 130     twice.     a. Recheck fasting glucose at the next office visit or hemoglobin A1c.  6. Dr. Karilyn Cota will followup and evaluate [name] with possible discharge     home soon.     _____________________________________  ___________________________________________  Nicholas Lose, N.P.               Lionel December, M.D.   KC/MEDQ  D:  07/07/2003  T:  07/07/2003  Job:  [id number]   cc:   Lionel December, M.D.  [address]  [place]  [place] [address]  Fax: [phone]

## 2011-02-21 NOTE — Procedures (Signed)
NAMEROBENA, EWY NO.:  [id number]   MEDICAL RECORD NO.:  [id number]          PATIENT TYPE:  OUT   LOCATION:  RAD                           FACILITY:  [facility]   PHYSICIAN:  Vida Roller, M.D.   DATE OF BIRTH:  December 22, 1921   DATE OF PROCEDURE:  08/19/2005  DATE OF DISCHARGE:                                    STRESS TEST   HISTORY:  The patient is an 77 year old female with no known coronary  disease with hypertension, hyperlipidemia, peripheral vascular disease. She  was referred for evaluation of difficult to control hypertension. She denies  any chest discomfort or shortness of breath.   BASELINE DATA:  Electrocardiogram reveals sinus rhythm at 62 beats per  minute and some nonspecific ST abnormalities, poor RV progression.   Baseline blood pressure is 130/90.   The patient was infused with 38 mg of adenosine over a 4-minute protocol  with Myoview injected at 3 minutes. The patient reported no symptoms. EKG  revealed frequent PACs, mild upsloping ST depression in the inferolateral  leads that resolved in recovery.   Final images and results pending M.D. review.      Jae Dire, P.A. [facility]      Vida Roller, M.D.  Electronically Signed    AB/MEDQ  D:  08/19/2005  T:  08/19/2005  Job:  [id number]

## 2011-02-21 NOTE — Discharge Summary (Signed)
NAME:  [name], [name]                       ACCOUNT NO.:  [id number]   MEDICAL RECORD NO.:  [id number]                   PATIENT TYPE:  INP   LOCATION:  A217                                 FACILITY:  [facility]   PHYSICIAN:  Lionel December, M.D.                 DATE OF BIRTH:  [birth year]   DATE OF ADMISSION:  07/06/2003  DATE OF DISCHARGE:  07/07/2003                                 DISCHARGE SUMMARY   DISCHARGE DIAGNOSES:  1. Transient hypertension possibly due to blood loss from a small laceration     of the left leg and vasovagal in origin.  2. Small traumatic left leg laceration which is sutured to control bleeding.  3. Anemia possibly due to blood loss from left leg laceration.  4. Hypokalemia.  5. History of hypertension.  6. Osteoarthrosis.  7. Hyperlipidemia.   CONDITION ON DISCHARGE:  Improved and stable.   DISCHARGE MEDICATIONS:  1. HCTZ 12.5 mg every other day or daily.  2. KCl 20 mEq daily or on days when she takes diuretic.  3. Zetia 10 mg daily.  4. Altace 5 mg daily.  5. Aleve 1-2 tablets b.i.d. p.r.n.  6. MVI daily.   HOSPITAL COURSE:  Kamiya is a 77 year old African-American female who hit  her left leg against a sharp object and ended up with a small laceration.  She tried to control the bleeding by putting on pressure, however, this did  not work.  She kept bleeding for several minutes.  She finally came to the  emergency room.  She was seen by Dr. Colon Branch.  She had this laceration  sutured with control of hemorrhage. Her lab studies revealed her to be  anemic with H&H of 10.7 and 31.4.  Her MCV and platelet count was normal.  They repeated her H&H about 90 minutes later and it was 9.8 and 28.9.  Dr.  Colon Branch felt that she could go home, however, when she stood up she became  weak and lightheaded and was noted to be hypertensive.  It was, therefore,  decided to monitor her over night.   She did not have any arrhythmia on the monitor.  She did not have any  more  bleeding from her leg laceration.  Her lab studies also revealed serum  potassium of 3.0 and serum sodium was also mildly elevated at 131.  Random  glucose was 177 and this was repeated the next morning and was 133.  Her  albumin was mildly decreased at 2.8.  Her LFTs were otherwise normal.  Her  hemoglobin A1c was 6 which was normal.  Iron studies revealed serum iron of  86, TIBC of 290, saturation of 30%.  Ferritin was 101, B12 of 543, and  folate level was 372.  Therefore, anemia was felt to be primarily due to  acute blood loss from her leg laceration.   The patient was given IV fluids during the  night.  The following morning she  was asymptomatic.  She ambulated on the floor without any postural symptoms.  She was tolerating a diet.  We repeated her H&H and it was 9.6 and 28.2.  She was tolerating it well.  Therefore, did not feel that she needed  transfusion.  Her electrolytes were repeated.  Her sodium was up to 135 and  potassium was 3.7.  The patient was discharged to home in stable condition.  She is to return to the office on July 13, 2003.  She will have H&H and  electrolytes at that time.     ___________________________________________                                         Lionel December, M.D.   NR/MEDQ  D:  07/19/2003  T:  07/19/2003  Job:  [id number]

## 2011-02-21 NOTE — Procedures (Signed)
[name], [name] NO.:  [id number]   MEDICAL RECORD NO.:  [id number]          PATIENT TYPE:  OUT   LOCATION:  RAD                           FACILITY:  [facility]   PHYSICIAN:  Vida Roller, M.D.   DATE OF BIRTH:  02/06/1922   DATE OF PROCEDURE:  08/22/2005  DATE OF DISCHARGE:                                  ECHOCARDIOGRAM   PRIMARY CARE PHYSICIAN:  Lionel December, M.D.   TAPE NUMBER:  [id number]   TAPE COUNT:  [id number]   HISTORY OF PRESENT ILLNESS:  This is an 77 year old female with an abnormal  electrocardiogram and hypertension.  Technical quality of this study is  adequate.   M-MODE TRACINGS:  Aorta 30 mm.   Left atrium 45 mm.   Septum 12 mm.   Posterior wall 11 mm.   Left ventricular diastolic dimension 41 mm.   Left ventricular systolic dimension 28 mm.   A 2D AND DOPPLER IMAGING:  Left ventricle is normal size with normal  systolic function.  Estimated ejection fraction 60-65%.  There is mild left  ventricular hypertrophy which is concentric.  No wall motion abnormalities  are seen.   The right ventricle is top normal in size, reserved systolic function.   Both atria are enlarged.   The aortic valve is sclerotic with no stenosis or regurgitation.   The mitral valve has mild to moderate annular calcification with mild  regurgitation.   Tricuspid valve has mild regurgitation.   There is no pericardial effusion.   The inferior vena cava appears to be normal size.   The aortic root appears to be mildly dilated on one view.  Further  evaluation will be recommended.      Vida Roller, M.D.  Electronically Signed     JH/MEDQ  D:  08/22/2005  T:  08/22/2005  Job:  [id number]

## 2011-03-09 ENCOUNTER — Other Ambulatory Visit: Payer: Self-pay | Admitting: Orthopedic Surgery

## 2011-04-08 ENCOUNTER — Inpatient Hospital Stay (HOSPITAL_COMMUNITY)
Admission: EM | Admit: 2011-04-08 | Discharge: 2011-04-15 | DRG: 683 | Disposition: A | Discharge status: Skilled Nursing Facility | Payer: Medicare Other | Attending: Internal Medicine | Admitting: Internal Medicine

## 2011-04-08 ENCOUNTER — Emergency Department (HOSPITAL_COMMUNITY): Payer: Medicare Other

## 2011-04-08 DIAGNOSIS — I10 Essential (primary) hypertension: Secondary | ICD-10-CM | POA: Diagnosis present

## 2011-04-08 DIAGNOSIS — R519 Headache, unspecified: Secondary | ICD-10-CM | POA: Diagnosis not present

## 2011-04-08 DIAGNOSIS — E785 Hyperlipidemia, unspecified: Secondary | ICD-10-CM | POA: Diagnosis present

## 2011-04-08 DIAGNOSIS — I959 Hypotension, unspecified: Secondary | ICD-10-CM | POA: Diagnosis present

## 2011-04-08 DIAGNOSIS — A498 Other bacterial infections of unspecified site: Secondary | ICD-10-CM | POA: Diagnosis present

## 2011-04-08 DIAGNOSIS — N390 Urinary tract infection, site not specified: Secondary | ICD-10-CM | POA: Diagnosis present

## 2011-04-08 DIAGNOSIS — E860 Dehydration: Secondary | ICD-10-CM | POA: Diagnosis present

## 2011-04-08 DIAGNOSIS — R51 Headache: Secondary | ICD-10-CM | POA: Diagnosis not present

## 2011-04-08 DIAGNOSIS — N179 Acute kidney failure, unspecified: Principal | ICD-10-CM | POA: Diagnosis present

## 2011-04-08 DIAGNOSIS — E876 Hypokalemia: Secondary | ICD-10-CM | POA: Diagnosis not present

## 2011-04-08 DIAGNOSIS — M6282 Rhabdomyolysis: Secondary | ICD-10-CM | POA: Diagnosis present

## 2011-04-08 LAB — DIFFERENTIAL
Blasts: 0 %
Eosinophils Relative: 0 % (ref 0–5)
Lymphs Abs: 0.9 10*3/uL (ref 0.7–4.0)
Metamyelocytes Relative: 0 %
Monocytes Absolute: 1.4 10*3/uL — ABNORMAL HIGH (ref 0.1–1.0)
Monocytes Relative: 3 % (ref 3–12)
Neutrophils Relative %: 95 % — ABNORMAL HIGH (ref 43–77)
Promyelocytes Absolute: 0 %
nRBC: 0 /100 WBC

## 2011-04-08 LAB — BASIC METABOLIC PANEL
Chloride: 92 mEq/L — ABNORMAL LOW (ref 96–112)
GFR calc Af Amer: 27 mL/min — ABNORMAL LOW (ref >60)
GFR calc non Af Amer: 22 mL/min — ABNORMAL LOW (ref >60)
Potassium: 3.8 mEq/L (ref 3.5–5.1)

## 2011-04-08 LAB — URINALYSIS, ROUTINE W REFLEX MICROSCOPIC
Nitrite: POSITIVE — AB
Specific Gravity, Urine: 1.015 (ref 1.005–1.030)
Urobilinogen, UA: 1 mg/dL (ref 0.0–1.0)
pH: 6.5 (ref 5.0–8.0)

## 2011-04-08 LAB — CBC
HCT: 28.9 % — ABNORMAL LOW (ref 36.0–46.0)
Hemoglobin: 10.4 g/dL — ABNORMAL LOW (ref 12.0–15.0)
MCV: 90.9 fL (ref 78.0–100.0)
RBC: 3.18 MIL/uL — ABNORMAL LOW (ref 3.87–5.11)
WBC: 45.8 10*3/uL — ABNORMAL HIGH (ref 4.0–10.5)

## 2011-04-08 LAB — URINE MICROSCOPIC-ADD ON

## 2011-04-09 LAB — BASIC METABOLIC PANEL
BUN: 59 mg/dL — ABNORMAL HIGH (ref 6–23)
CO2: 25 mEq/L (ref 19–32)
Calcium: 9.7 mg/dL (ref 8.4–10.5)
GFR calc non Af Amer: 30 mL/min — ABNORMAL LOW (ref >60)
Glucose, Bld: 148 mg/dL — ABNORMAL HIGH (ref 70–99)
Sodium: 133 mEq/L — ABNORMAL LOW (ref 135–145)

## 2011-04-09 LAB — T4, FREE: Free T4: 0.88 ng/dL (ref 0.80–1.80)

## 2011-04-09 LAB — DIFFERENTIAL
Basophils Absolute: 0.1 10*3/uL (ref 0.0–0.1)
Basophils Relative: 0 % (ref 0–1)
Eosinophils Absolute: 0 10*3/uL (ref 0.0–0.7)
Eosinophils Relative: 0 % (ref 0–5)
Monocytes Absolute: 1.8 10*3/uL — ABNORMAL HIGH (ref 0.1–1.0)
Monocytes Relative: 5 % (ref 3–12)
Neutro Abs: 31.3 10*3/uL — ABNORMAL HIGH (ref 1.7–7.7)

## 2011-04-09 LAB — CBC
MCH: 32.5 pg (ref 26.0–34.0)
MCHC: 35.9 g/dL (ref 30.0–36.0)
MCV: 90.4 fL (ref 78.0–100.0)
Platelets: 164 10*3/uL (ref 150–400)
RBC: 3.14 MIL/uL — ABNORMAL LOW (ref 3.87–5.11)
RDW: 13.4 % (ref 11.5–15.5)

## 2011-04-09 LAB — TROPONIN I: Troponin I: 0.81 ng/mL (ref <0.30)

## 2011-04-09 LAB — CK TOTAL AND CKMB (NOT AT ARMC)
CK, MB: 22.2 ng/mL (ref 0.3–4.0)
Relative Index: 0.8 (ref 0.0–2.5)
Total CK: 794 U/L — ABNORMAL HIGH (ref 7–177)

## 2011-04-10 LAB — CBC
HCT: 30.5 % — ABNORMAL LOW (ref 36.0–46.0)
MCH: 31.5 pg (ref 26.0–34.0)
MCV: 89.7 fL (ref 78.0–100.0)
RBC: 3.4 MIL/uL — ABNORMAL LOW (ref 3.87–5.11)
WBC: 21.9 10*3/uL — ABNORMAL HIGH (ref 4.0–10.5)

## 2011-04-10 LAB — BASIC METABOLIC PANEL
BUN: 43 mg/dL — ABNORMAL HIGH (ref 6–23)
CO2: 24 mEq/L (ref 19–32)
Chloride: 101 mEq/L (ref 96–112)
Creatinine, Ser: 1.22 mg/dL — ABNORMAL HIGH (ref 0.50–1.10)
GFR calc Af Amer: 50 mL/min — ABNORMAL LOW (ref >60)
Potassium: 3.4 mEq/L — ABNORMAL LOW (ref 3.5–5.1)

## 2011-04-10 LAB — DIFFERENTIAL
Eosinophils Relative: 0 % (ref 0–5)
Lymphocytes Relative: 7 % — ABNORMAL LOW (ref 12–46)
Lymphs Abs: 1.6 10*3/uL (ref 0.7–4.0)
Monocytes Relative: 6 % (ref 3–12)
Neutrophils Relative %: 86 % — ABNORMAL HIGH (ref 43–77)

## 2011-04-11 LAB — BASIC METABOLIC PANEL
BUN: 32 mg/dL — ABNORMAL HIGH (ref 6–23)
CO2: 23 mEq/L (ref 19–32)
Chloride: 104 mEq/L (ref 96–112)
Creatinine, Ser: 1.11 mg/dL — ABNORMAL HIGH (ref 0.50–1.10)
Glucose, Bld: 124 mg/dL — ABNORMAL HIGH (ref 70–99)

## 2011-04-11 LAB — CBC
HCT: 30.6 % — ABNORMAL LOW (ref 36.0–46.0)
Hemoglobin: 10.7 g/dL — ABNORMAL LOW (ref 12.0–15.0)
MCV: 90.8 fL (ref 78.0–100.0)
RBC: 3.37 MIL/uL — ABNORMAL LOW (ref 3.87–5.11)
WBC: 23 10*3/uL — ABNORMAL HIGH (ref 4.0–10.5)

## 2011-04-11 LAB — URINE MICROSCOPIC-ADD ON

## 2011-04-11 LAB — URINALYSIS, ROUTINE W REFLEX MICROSCOPIC
Bilirubin Urine: NEGATIVE
Ketones, ur: NEGATIVE mg/dL
Nitrite: NEGATIVE
Urobilinogen, UA: 0.2 mg/dL (ref 0.0–1.0)

## 2011-04-11 LAB — DIFFERENTIAL
Eosinophils Absolute: 0 10*3/uL (ref 0.0–0.7)
Eosinophils Relative: 0 % (ref 0–5)
Lymphs Abs: 1.4 10*3/uL (ref 0.7–4.0)
Monocytes Absolute: 2 10*3/uL — ABNORMAL HIGH (ref 0.1–1.0)
Monocytes Relative: 9 % (ref 3–12)

## 2011-04-11 LAB — CK TOTAL AND CKMB (NOT AT ARMC): Total CK: 146 U/L (ref 7–177)

## 2011-04-11 MED ORDER — HYDRALAZINE HCL 20 MG/ML IJ SOLN: 10.0000 mg | INTRAMUSCULAR | Status: DC | PRN

## 2011-04-11 MED ORDER — CEFTRIAXONE SODIUM 1 G IJ SOLR
1.0000 g | INTRAMUSCULAR | Status: DC
  Administered 2011-04-12 – 2011-04-14 (×3): 1 g via INTRAVENOUS
  Filled 2011-04-11 (×4): qty 1

## 2011-04-11 MED ORDER — SODIUM CHLORIDE 0.9 % IV SOLN
INTRAVENOUS | Status: DC
  Administered 2011-04-12 – 2011-04-13 (×2): 950 mL via INTRAVENOUS

## 2011-04-11 MED ORDER — SIMVASTATIN 20 MG PO TABS
40.0000 mg | ORAL_TABLET | Freq: Every day | ORAL | Status: DC
  Administered 2011-04-12 – 2011-04-13 (×2): 40 mg via ORAL
  Filled 2011-04-11 (×2): qty 2

## 2011-04-11 MED ORDER — ACETAMINOPHEN 500 MG PO TABS
500.0000 mg | ORAL_TABLET | Freq: Four times a day (QID) | ORAL | Status: DC | PRN
  Administered 2011-04-12 – 2011-04-14 (×4): 500 mg via ORAL
  Filled 2011-04-11 (×4): qty 1

## 2011-04-11 MED ORDER — ENOXAPARIN SODIUM 40 MG/0.4ML ~~LOC~~ SOLN
40.0000 mg | SUBCUTANEOUS | Status: DC
  Administered 2011-04-12 – 2011-04-15 (×4): 40 mg via SUBCUTANEOUS
  Filled 2011-04-11 (×4): qty 0.4

## 2011-04-12 DIAGNOSIS — I959 Hypotension, unspecified: Secondary | ICD-10-CM | POA: Diagnosis present

## 2011-04-12 DIAGNOSIS — R519 Headache, unspecified: Secondary | ICD-10-CM | POA: Diagnosis not present

## 2011-04-12 DIAGNOSIS — B9620 Unspecified Escherichia coli [E. coli] as the cause of diseases classified elsewhere: Secondary | ICD-10-CM | POA: Diagnosis present

## 2011-04-12 DIAGNOSIS — N179 Acute kidney failure, unspecified: Secondary | ICD-10-CM | POA: Diagnosis present

## 2011-04-12 DIAGNOSIS — R51 Headache: Secondary | ICD-10-CM | POA: Diagnosis not present

## 2011-04-12 DIAGNOSIS — E876 Hypokalemia: Secondary | ICD-10-CM | POA: Diagnosis not present

## 2011-04-12 DIAGNOSIS — E860 Dehydration: Secondary | ICD-10-CM | POA: Diagnosis present

## 2011-04-12 DIAGNOSIS — M6282 Rhabdomyolysis: Secondary | ICD-10-CM | POA: Diagnosis present

## 2011-04-12 LAB — CBC
HCT: 28.4 % — ABNORMAL LOW (ref 36.0–46.0)
MCHC: 34.9 g/dL (ref 30.0–36.0)
MCV: 91.3 fL (ref 78.0–100.0)
Platelets: 143 10*3/uL — ABNORMAL LOW (ref 150–400)
RDW: 13.9 % (ref 11.5–15.5)
WBC: 20.6 10*3/uL — ABNORMAL HIGH (ref 4.0–10.5)

## 2011-04-12 LAB — DIFFERENTIAL
Eosinophils Absolute: 0 10*3/uL (ref 0.0–0.7)
Eosinophils Relative: 0 % (ref 0–5)
Lymphs Abs: 1.5 10*3/uL (ref 0.7–4.0)
Monocytes Absolute: 2.1 10*3/uL — ABNORMAL HIGH (ref 0.1–1.0)

## 2011-04-12 LAB — BASIC METABOLIC PANEL
BUN: 25 mg/dL — ABNORMAL HIGH (ref 6–23)
Chloride: 105 mEq/L (ref 96–112)
Creatinine, Ser: 0.97 mg/dL (ref 0.50–1.10)
GFR calc Af Amer: >60 mL/min (ref >60)
GFR calc non Af Amer: 54 mL/min — ABNORMAL LOW (ref >60)
Glucose, Bld: 127 mg/dL — ABNORMAL HIGH (ref 70–99)

## 2011-04-12 LAB — URINE CULTURE
Colony Count: >=100000
Culture  Setup Time: 201207040214

## 2011-04-12 MED ORDER — HYDROCODONE-ACETAMINOPHEN 5-325 MG PO TABS
1.0000 | ORAL_TABLET | ORAL | Status: DC | PRN
  Administered 2011-04-12 – 2011-04-13 (×2): 1 via ORAL
  Filled 2011-04-12 (×2): qty 1

## 2011-04-12 MED ORDER — POTASSIUM CHLORIDE CRYS ER 10 MEQ PO TBCR
10.0000 meq | EXTENDED_RELEASE_TABLET | Freq: Two times a day (BID) | ORAL | Status: DC
  Administered 2011-04-12 – 2011-04-13 (×3): 10 meq via ORAL
  Filled 2011-04-12 (×3): qty 1

## 2011-04-12 NOTE — Group Therapy Note (Signed)
[name], [name] NO.:  [id number]  MEDICAL RECORD NO.:  [id number]  LOCATION:  A337                          FACILITY:  [facility]  PHYSICIAN:  Mila Homer. Sudie Bailey, M.D.DATE OF BIRTH:  [birth year]  DATE OF PROCEDURE: DATE OF DISCHARGE:                                PROGRESS NOTE   SUBJECTIVE:  This 77 year old was admitted to the hospital recently with dehydration and acute on chronic renal failure secondary to an E-coli urinary tract infection.  She is feeling better today but has a bad headache that has not responded to Tylenol.  She continues on Lovenox injections 40 mg subcu q.24 hours, simvastatin 40 mg q. 6 p.m., ceftriaxone 1 gram IV q.24 hours, hydralazine 10 mg IV q.4 hours p.r.n. systolic blood pressure greater than 140.  Her niece is with her in the room.  OBJECTIVE:  VITAL SIGNS:  Her temperature is 98 degrees and pulse 94, respiratory rate is 24, blood pressure 144/79, O2 sat is 100%. GENERAL:  She is in no acute distress.  She is sitting up in bed, oriented and alert. LUNGS:  Appear to be clear throughout.  She is moving air well. HEART:  Regular rhythm and rate of about 80. ABDOMEN:  Soft without organomegaly or mass or tenderness today. EXTREMITIES:  She does have trace edema of the ankles. HEAD:  She is complaining of a headache which involves the frontal area of the head but also the vertex of the head as well.  Today's white blood cell count is 20,600, down from 23,000 yesterday. Hemoglobin is 9.9, BUN is 25 with a creatinine of 0.97 down from 32/1.11 yesterday.  Admission white count was 45,800.  Urine has grown out greater than 100,000 E-coli.  Potassium was 3.4 today and the blood culture is pending.  ASSESSMENT: 1. Escherichia coli urinary tract infection. 2. Acute renal failure, improved. 3. Rhabdomyolysis. 4. Severe dehydration. 5. Hypotension, improved. 6. Headache. 7. Hypokalemia.  PLAN:  Continue with IV fluids at 50 mL  an hour.  Add hydrocodone/APAP 7.5/325 q.4 hours for headache since headache has not cleared with Tylenol.  Add KCl 10 mEq daily.  Recheck a CBC and BMP in the morning. I discussed all of this with the patient and her niece.     Mila Homer. Sudie Bailey, M.D.     SDK/MEDQ  D:  04/12/2011  T:  04/12/2011  Job:  [id number]

## 2011-04-12 NOTE — Plan of Care (Signed)
Problem: Phase I Progression Outcomes Goal: Vital Signs stable- temperature less than 102 Outcome: Completed/Met Date Met:  04/12/11 afebrile Goal: Initial discharge plan identified Outcome: Progressing md stated that pt may possibly dc monday

## 2011-04-12 NOTE — H&P (Signed)
NAME:  KAMI, KUBE NO.:  [id number]  MEDICAL RECORD NO.:  [id number]  LOCATION:                                 FACILITY:  PHYSICIAN:  GEBRESELASSIE NIDA, MD DATE OF BIRTH:  Nov 08, 1921  DATE OF ADMISSION: DATE OF DISCHARGE:  LH                             HISTORY & PHYSICAL   REASON FOR ADMISSION:  Urinary tract infection, rhabdomyolysis, and dehydration with acute renal failure history.  HISTORY OF PRESENT ILLNESS:  This is an 77 year old female with multiple medical problems including hypertension, hyperlipidemia, osteoarthritis, osteoporosis, asthma, abdominal aortic aneurysm.  She is brought into the emergency room through a EMS today due to syncopal episode which happened at home at midday today.  She states that she was able to wake up in the morning, clean her house, about to fix breakfast to eat, however, she went to the bathroom and then was not able to lift herself up.  When the family members called, she was not answering her phone, hence, they rushed and found her on the floor with unconsciousness.  The patient was brought to ER where she was found to have slightly dehydrated with blood pressure of 95 systolic, however, she was able to communicate and was able to walk.  She gave history of leg weakness prior to inability to lift herself up.  In the ER, she was found to have elevated WBC count of 45,000 with left shift and elevated CK-MB and acute renal failure for which she is considered for hospitalization. She was also found to have urinary tract infection which necessitated antibiotic therapy.  PAST MEDICAL HISTORY:  As above.  PAST SURGICAL HISTORY:  Recent colonoscopy significant for melanosis coli and internal hemorrhoids and anal papillae, history of pneumonia in the past.  ALLERGIES:  Allergies to no known medications.  HOME MEDICATIONS:  Included: 1. Amlodipine 5 mg daily. 2. Ramipril 10 mg daily. 3. Lasix 20 mg daily. 4.  Actonel 35 mg q. weekly.  FAMILY HISTORY:  Noncontributory.  REVIEW OF SYSTEMS:  As in HPI, otherwise no headaches, no bleeding, no chest pain, no shortness of breath.  The rest of the systems have been reviewed and negative.  PHYSICAL EXAM:  GENERAL:  She is alert and oriented x3. VITAL SIGNS:  Her blood pressure was 97/61, pulse rate 93, respiratory rate 20, subsequent blood pressure was found to be improving at 147/94. HEENT:  Moist mucous membranes.  Negative for JVD or thyromegaly.  No icterus.  No pallor. NECK:  Negative for goiter. CHEST:  Clear to auscultation bilaterally. CARDIOVASCULAR:  Normal S1 and S2.  No murmur, no rub, no gallop.  No rebound tenderness. EXTREMITIES:  No edema. CNS:  Nonfocal. SKIN:  Has no rash, no hyperemia.  LABS:  Her hematology showed WBC 45,800 with left shift with neutrophils 43,500, red blood cells mass 3.18, hemoglobin 10.4, hematocrit 28.9, platelet count at 199,000.  Her chemistry shows sodium 132, potassium 3.8, chloride 92, bicarb 27, glucose 123, BUN 66, creatinine 2.11, calcium 10.6, troponin 0.8.  Creatinine kinase 2867, CK-MB 22.2. Urinalysis showed cloudy urine with nitrite positive, leukocytes trace, WBC 11-20, bacteria many.  ASSESSMENT AND PLAN:  An 77 year old female patient with  multiple medical problems, was found to have presyncopal episode at home with loss of consciousness for unquantified duration of time, was found to have dehydration, urinary tract infection, and rhabdomyolysis. 1. Urinary tract infection.  We will continue with Rocephin 1 gram     every 24 hours, pharmacy to dose. 2. Hypotension.  We will hold all blood pressure medications.  We will     measure input and output strictly.  We will use hydralazine 10 mg     every 4 hours IV p.r.n. systolic blood pressure greater than 140. 3. Rhabdomyolysis probably due to prolonged down status.  I will     continue to hydrate with normal saline at 75 mL per hours 4.  Hyperlipidemia. We will proceed with pravastatin 80 mg daily.  We     will obtain troponin and EKG every 8 hours x2.  DVT prophylaxis     with Lovenox 40 mg subcu daily.  If renal function does not     resolve, we will obtain Nephrology consult tomorrow.  We will     repeat CBC, BMP, and a.m. cortisol in the morning.                                           ______________________________ Purcell Nails, MD     GN/MEDQ  D:  04/08/2011  T:  04/09/2011  Job:  [id number]  Electronically Signed by Purcell Nails MD on 04/12/2011 12:09:05 AM

## 2011-04-12 NOTE — Progress Notes (Signed)
[id number] 

## 2011-04-13 LAB — BASIC METABOLIC PANEL
Calcium: 7.6 mg/dL — ABNORMAL LOW (ref 8.4–10.5)
GFR calc Af Amer: >60 mL/min (ref >60)
GFR calc non Af Amer: >60 mL/min (ref >60)
Glucose, Bld: 113 mg/dL — ABNORMAL HIGH (ref 70–99)
Potassium: 3.3 mEq/L — ABNORMAL LOW (ref 3.5–5.1)
Sodium: 135 mEq/L (ref 135–145)

## 2011-04-13 LAB — CBC
Hemoglobin: 9.8 g/dL — ABNORMAL LOW (ref 12.0–15.0)
MCH: 32.3 pg (ref 26.0–34.0)
MCHC: 35.4 g/dL (ref 30.0–36.0)
Platelets: 201 10*3/uL (ref 150–400)
RDW: 14 % (ref 11.5–15.5)

## 2011-04-13 LAB — URINE CULTURE
Culture  Setup Time: 201207070127
Special Requests: NEGATIVE

## 2011-04-13 LAB — DIFFERENTIAL
Basophils Absolute: 0.1 10*3/uL (ref 0.0–0.1)
Basophils Relative: 0 % (ref 0–1)
Eosinophils Absolute: 0.1 10*3/uL (ref 0.0–0.7)
Neutro Abs: 16.7 10*3/uL — ABNORMAL HIGH (ref 1.7–7.7)
Neutrophils Relative %: 81 % — ABNORMAL HIGH (ref 43–77)

## 2011-04-13 MED ORDER — POTASSIUM CHLORIDE CRYS ER 20 MEQ PO TBCR
20.0000 meq | EXTENDED_RELEASE_TABLET | Freq: Two times a day (BID) | ORAL | Status: DC
  Administered 2011-04-14 – 2011-04-15 (×3): 20 meq via ORAL
  Filled 2011-04-13 (×3): qty 1

## 2011-04-13 MED ORDER — HYDROCODONE-ACETAMINOPHEN 5-325 MG PO TABS
2.0000 | ORAL_TABLET | ORAL | Status: DC | PRN
  Administered 2011-04-13 – 2011-04-15 (×3): 2 via ORAL
  Filled 2011-04-13 (×3): qty 2

## 2011-04-13 NOTE — Group Therapy Note (Signed)
[name], [name] NO.:  [id number]  MEDICAL RECORD NO.:  [id number]  LOCATION:  A337                          FACILITY:  [facility]  PHYSICIAN:  Mila Homer. Sudie Bailey, M.D.DATE OF BIRTH:  06-Feb-1922  DATE OF PROCEDURE:  04/13/2011 DATE OF DISCHARGE:                                PROGRESS NOTE   SUBJECTIVE:  The patient had no specific complaints initially, but then says she has pain in the back, pain in the knees, pain in the lower abdomen.  OBJECTIVE:  VITAL SIGNS:  Temperature is 90.6, pulse 98 rest rate 26, blood pressure varies from 144/79 to 169/90 today, but most recent was 124/72.  Her O2 sat is 99%. GENERAL:  She is supine in bed at the time I examined her.  Her niece was in attendance.  She has no acute distress.  Well-developed, well- nourished. HEART:  Had a regular rhythm rate about 80. LUNGS:  Clear throughout.  She is moving air well. ABDOMEN:  Soft without organomegaly or mass, but she did have some tenderness in the suprapubic region also.  Some CVA and flank pain on palpation.  There is no edema of the ankles.  She has had some tenderness of her knees.  LABORATORY DATA:  Her urine grew out 65,000 calories with multiple organisms.  Potassium came back 3.3 on current potassium dose of 10 mEq b.i.d.  Her white cell count still elevated 20,600.  ASSESSMENT: 1. E coli urinary tract infection. 2. Hypertension. 3. Acute renal failure. 4. Rhabdomyolysis. 5. Severe dehydration. 6. Hypokalemia. 7. Headache.  PLAN:  Continue to monitor the CBC and the BMP.  Increase KCl to 20 mg b.i.d.  Increase hydrocodone/APAP from 5/325 one tablet q.4 h to 2 tablets q.4 h.     Mila Homer. Sudie Bailey, M.D.     SDK/MEDQ  D:  04/13/2011  T:  04/13/2011  Job:  [id number]

## 2011-04-13 NOTE — Progress Notes (Signed)
[id number] 

## 2011-04-14 MED ORDER — AMLODIPINE BESYLATE 5 MG PO TABS
5.0000 mg | ORAL_TABLET | Freq: Every day | ORAL | Status: DC
  Administered 2011-04-14 – 2011-04-15 (×2): 5 mg via ORAL
  Filled 2011-04-14 (×2): qty 1

## 2011-04-14 MED ORDER — ROSUVASTATIN CALCIUM 20 MG PO TABS
10.0000 mg | ORAL_TABLET | Freq: Every day | ORAL | Status: DC
  Administered 2011-04-14: 10 mg via ORAL
  Filled 2011-04-14: qty 2

## 2011-04-14 MED ORDER — RAMIPRIL 10 MG PO CAPS
10.0000 mg | ORAL_CAPSULE | Freq: Every day | ORAL | Status: DC
  Administered 2011-04-14 – 2011-04-15 (×2): 10 mg via ORAL
  Filled 2011-04-14 (×2): qty 1

## 2011-04-14 NOTE — Plan of Care (Signed)
Problem: Phase II Progression Outcomes Goal: Progress activity as tolerated unless otherwise ordered Outcome: Progressing PT working with the patient Goal: Discharge plan established Outcome: Progressing Patient is to be placed in a long term care facility/report.

## 2011-04-14 NOTE — Consult Note (Signed)
CSW presented bed offers to pt and she chooses [facility]. CSW to notify facility. Awaiting stability for d/c. Karn Cassis

## 2011-04-14 NOTE — Progress Notes (Signed)
  Dictation Number: 04/14/11 [id number]

## 2011-04-14 NOTE — Group Therapy Note (Signed)
NAME:  [name], [name] NO.:  [id number]  MEDICAL RECORD NO.:  [id number]  LOCATION:  A337                          FACILITY:  [facility]  PHYSICIAN:  Purcell Nails, MD DATE OF BIRTH:  13-Jan-1922  DATE OF PROCEDURE: DATE OF DISCHARGE:                                PROGRESS NOTE   The patient is currently in hospital for treatment of urinary tract infection, acute renal failure, and rhabdomyolysis.  She is an 77 year old female patient with hypertension, hyperlipidemia, presented with above complaint for admission on April 08, 2011.  She is currently on IV ceftriaxone and she is making good clinical progress.  SUBJECTIVE:  She has no new complaints, did have good bowel movement, eating well.  No shortness of breath.  No chest pain.  OBJECTIVE:  GENERAL:  She is alert and oriented x3. VITAL SIGNS:  Blood pressure is 160/87, pulse rate 87, temperature 100 degrees Fahrenheit. HEENT:  Moist mucous membranes. CHEST:  Clear to auscultation except bases where she has a few crackles. CVS:  No murmur, no gallop. ABDOMEN:  Soft and nontender. EXTREMITIES:  No edema. CNS:  Nonfocal. SKIN:  Has no rash.  No hyperemia.  LABORATORY DATA:  CBC:  WBC 20.6, improved from 45, hemoglobin 9.8, hematocrit 27.7, platelet count 201.  Her chemistry shows sodium 135, potassium 3.3, chloride 105, bicarb 22, BUN 22, creatinine 0.83, calcium 7.6.  ASSESSMENT AND PLAN: 1. Urinary tract infection, responding to ceftriaxone, WBC count still     high, however, improved.  We will need to continue with the same     antibiotic awaiting culture and sensitivity. 2. Acute renal failure, resolved. 3. Rhabdomyolysis. 4. Hypertension,uncontrolled.  I will resume her home amlodipine and     ramipril. 5. Hypokalemia.  I will continue to require a potassium supplement. 6. Code status.  Full code. 7. Disposition  To be determined.          ______________________________ Purcell Nails,  MD     GN/MEDQ  D:  04/14/2011  T:  04/14/2011  Job:  [id number]

## 2011-04-15 ENCOUNTER — Inpatient Hospital Stay
Admission: RE | Admit: 2011-04-15 | Discharge: 2011-05-29 | Disposition: A | Discharge status: Home / Self Care | Payer: PRIVATE HEALTH INSURANCE | Source: Ambulatory Visit | Attending: Internal Medicine | Admitting: Internal Medicine

## 2011-04-15 DIAGNOSIS — K469 Unspecified abdominal hernia without obstruction or gangrene: Secondary | ICD-10-CM

## 2011-04-15 DIAGNOSIS — M1990 Unspecified osteoarthritis, unspecified site: Secondary | ICD-10-CM

## 2011-04-15 DIAGNOSIS — K567 Ileus, unspecified: Principal | ICD-10-CM

## 2011-04-15 LAB — COMPREHENSIVE METABOLIC PANEL
AST: 23 U/L (ref 0–37)
Alkaline Phosphatase: 358 U/L — ABNORMAL HIGH (ref 39–117)
CO2: 22 mEq/L (ref 19–32)
Calcium: 8.4 mg/dL (ref 8.4–10.5)
Chloride: 109 mEq/L (ref 96–112)
Creatinine, Ser: 0.75 mg/dL (ref 0.50–1.10)
Glucose, Bld: 104 mg/dL — ABNORMAL HIGH (ref 70–99)
Sodium: 138 mEq/L (ref 135–145)

## 2011-04-15 LAB — CBC
HCT: 25.4 % — ABNORMAL LOW (ref 36.0–46.0)
MCV: 93 fL (ref 78.0–100.0)
RBC: 2.73 MIL/uL — ABNORMAL LOW (ref 3.87–5.11)
WBC: 14.6 10*3/uL — ABNORMAL HIGH (ref 4.0–10.5)

## 2011-04-15 LAB — DIFFERENTIAL
Eosinophils Relative: 0 % (ref 0–5)
Lymphocytes Relative: 15 % (ref 12–46)
Lymphs Abs: 2.2 10*3/uL (ref 0.7–4.0)
Monocytes Absolute: 1 10*3/uL (ref 0.1–1.0)
Monocytes Relative: 7 % (ref 3–12)

## 2011-04-15 MED ORDER — DEXTROSE 5 % IV SOLN: 1.0000 g | INTRAVENOUS | Status: DC

## 2011-04-15 MED ORDER — AMLODIPINE BESYLATE 5 MG PO TABS: 5.0000 mg | ORAL_TABLET | Freq: Every day | ORAL | Status: DC

## 2011-04-15 MED ORDER — ACETAMINOPHEN 500 MG PO TABS: 500.0000 mg | ORAL_TABLET | Freq: Four times a day (QID) | ORAL | Status: AC | PRN

## 2011-04-15 MED ORDER — SODIUM CHLORIDE 0.9 % IV SOLN: 10.0000 mL | INTRAVENOUS | Status: DC

## 2011-04-15 MED ORDER — ENOXAPARIN SODIUM 40 MG/0.4ML ~~LOC~~ SOLN: 40.0000 mg | SUBCUTANEOUS | Status: DC

## 2011-04-15 MED ORDER — HYDRALAZINE HCL 20 MG/ML IJ SOLN: 10.0000 mg | INTRAMUSCULAR | Status: DC | PRN

## 2011-04-15 MED ORDER — FUROSEMIDE 40 MG PO TABS: 40.0000 mg | ORAL_TABLET | Freq: Two times a day (BID) | ORAL | Status: DC

## 2011-04-15 MED ORDER — RAMIPRIL 10 MG PO CAPS: 10.0000 mg | ORAL_CAPSULE | Freq: Every day | ORAL | Status: DC

## 2011-04-15 MED ORDER — SODIUM CHLORIDE 0.9 % IJ SOLN
INTRAMUSCULAR | Status: AC
  Filled 2011-04-15: qty 3

## 2011-04-15 NOTE — Discharge Summary (Signed)
Report called to Bethann Berkshire, LPN at [facility]. Verbalized understanding. Pt dc'd to [facility] via nursing staff.  Schonewitz, Candelaria Stagers 04/15/2011

## 2011-04-15 NOTE — Progress Notes (Signed)
  Please refer to Discharge Summary for progress note today.

## 2011-04-15 NOTE — Progress Notes (Signed)
Pt to d/c today to the [facility].  Confirmed this plan with Pt, [facility], and Pt's niece.  No further CSW intervention indicated at this time.  Zoila Shutter LCSW

## 2011-04-15 NOTE — Discharge Summary (Signed)
Physician Discharge Summary  Patient ID: [name] MRN: [id number] DOB/AGE: 1922/08/29 77 y.o.  Admit date: 04/08/2011 Discharge date: 04/15/2011  Admission Diagnoses:  Discharge Diagnoses:  Principal Problem:  *E-coli UTI Active Problems:  HYPERTENSION  Acute renal failure (ARF)  Rhabdomyolysis  Dehydration, severe  Hypotension  Headache  Hypokalemia   Discharged Condition: good  Hospital Course: 76 yo female admitted with UTI, rhabdomyolisis, and acute renal failure. She has had WBC count of 45K which was lowered to 14.6K on day of discharge. She was treated with IVF and IV ceftriaxone. Her renal failure improved, rhabdo cleared and pt continued to improve clinically. She will need to continue on IV abx for 4-5 days, and she is being discharged to [facility] center.  Consults: None Significant Diagnostic Studies: CBC, CMP, U/A, urine culture.  Treatments: continue Ceftriaxone, BP medications at [facility] center. Discharge Exam: Blood pressure 152/72, pulse 78, temperature 98.1 F (36.7 C), temperature source Oral, resp. rate 18, weight 65.998 kg (145 lb 8 oz), SpO2 93.00%. General appearance: alert, cooperative and no distress Resp: clear to auscultation bilaterally Cardio: regular rate and rhythm, S1, S2 normal, no murmur, click, rub or gallop GI: soft, non-tender; bowel sounds normal; no masses,  no organomegaly Extremities: extremities normal, atraumatic, no cyanosis or edema    Disposition: to [facility]   Current Discharge Medication List    CONTINUE these medications which have NOT CHANGED   Details  amLODipine (NORVASC) 5 MG tablet Take 5 mg by mouth daily.      aspirin EC 81 MG tablet Take 162 mg by mouth daily.      furosemide (LASIX) 40 MG tablet Take 40 mg by mouth. Take one tablet by mouth every 4 days as needed for fluid    pravastatin (PRAVACHOL) 80 MG tablet Take 80 mg by mouth daily.      ramipril (ALTACE) 10 MG capsule Take 10 mg by mouth daily.        Ceftriaxone 1gram IV q day for 4 days.   Signed: Chuckie Mccathern 04/15/2011, 7:38 AM  Physician Discharge Summary

## 2011-04-16 ENCOUNTER — Ambulatory Visit (HOSPITAL_COMMUNITY): Payer: Medicare Other | Attending: Internal Medicine

## 2011-04-16 DIAGNOSIS — R933 Abnormal findings on diagnostic imaging of other parts of digestive tract: Secondary | ICD-10-CM | POA: Insufficient documentation

## 2011-04-16 DIAGNOSIS — R109 Unspecified abdominal pain: Secondary | ICD-10-CM | POA: Insufficient documentation

## 2011-04-16 LAB — CULTURE, BLOOD (SINGLE)

## 2011-04-17 ENCOUNTER — Ambulatory Visit (HOSPITAL_COMMUNITY): Payer: Medicare Other | Attending: Internal Medicine

## 2011-04-17 DIAGNOSIS — R109 Unspecified abdominal pain: Secondary | ICD-10-CM | POA: Insufficient documentation

## 2011-04-17 DIAGNOSIS — I714 Abdominal aortic aneurysm, without rupture, unspecified: Secondary | ICD-10-CM | POA: Insufficient documentation

## 2011-04-17 DIAGNOSIS — R9389 Abnormal findings on diagnostic imaging of other specified body structures: Secondary | ICD-10-CM | POA: Insufficient documentation

## 2011-04-17 DIAGNOSIS — K769 Liver disease, unspecified: Secondary | ICD-10-CM | POA: Insufficient documentation

## 2011-04-18 ENCOUNTER — Ambulatory Visit (HOSPITAL_COMMUNITY): Payer: Medicare Other

## 2011-04-23 ENCOUNTER — Inpatient Hospital Stay (HOSPITAL_COMMUNITY): Payer: Medicare Other | Attending: Internal Medicine

## 2011-05-14 ENCOUNTER — Encounter: Payer: Self-pay | Admitting: Gastroenterology

## 2011-05-15 ENCOUNTER — Encounter: Payer: Self-pay | Admitting: Gastroenterology

## 2011-05-15 ENCOUNTER — Ambulatory Visit (INDEPENDENT_AMBULATORY_CARE_PROVIDER_SITE_OTHER): Payer: Medicare Other | Admitting: Gastroenterology

## 2011-05-15 VITALS — BP 132/88 | HR 90 | Temp 97.8°F | Ht 65.0 in | Wt 138.0 lb

## 2011-05-15 DIAGNOSIS — D649 Anemia, unspecified: Secondary | ICD-10-CM

## 2011-05-15 DIAGNOSIS — R748 Abnormal levels of other serum enzymes: Secondary | ICD-10-CM

## 2011-05-15 DIAGNOSIS — R945 Abnormal results of liver function studies: Secondary | ICD-10-CM

## 2011-05-15 NOTE — Patient Instructions (Signed)
Please complete labs.  We will be in touch shortly regarding the next step. Will likely need to evaluate your upper GI tract further, and we will let you know the plan after review of your blood work.

## 2011-05-15 NOTE — Progress Notes (Signed)
Referring Provider: Avon Gully, MD Primary Care Physician:  Avon Gully, MD  Chief Complaint  Patient presents with  . positive hemoccult    HPI:   [name] is a very pleasant 77 year old woman who presents today at the request of Dr. Felecia Shelling secondary to heme + stools and anemia. She actually was admitted to [facility] on July 3 after a fall. Admitted with dehydration, UTI, rhabdomyolysis. Has hx of chronically elevated alk phos since 1982. Work-up in past includes liver biopsy X 2. Findings on first one was portal inflammation, second was non-specific portal changes, copper stains negative. Seems to have elevated LFTs with infection and/or statins. Has had MRCP in past as well with finding os ductal dilation but no stones. This was in 2010. Question of proceeding with EUS if needed. Last AP on file July 2012 was 358, Albumin low at 1.9.  CT July 2012 with slightly lobulated and increased size of liver vs previous study (2006) can't entirely exclude cirrhosis, no definite nodularity identified.  CBC July 24: normocytic anemia with Hgb 9.6, Hct 29.2. Iron low at 29, Ferritin normal 165, GGT elevated 94, Sed rate elevated at 40.  She is completely asymptomatic. She denies abdominal pain. Wt is stable, with reports of ranging anywhere from 130-145. Denies any lack of appetite. No N/V. No dysphagia. Denies reflux. BM every day. No melena or brbpr. No fever, chills, jaundice. Last colonoscopy Jan of this year (2012). Anal papilla, internal hemorrhoids, melanosis coli.    Past Medical History  Diagnosis Date  . Memory loss   . Leukopenia   . Hyponatremia   . Other and unspecified hyperlipidemia   . Proteinuria   . Edema   . Unspecified asthma, with exacerbation   . Arthropathy, unspecified, site unspecified   . Unspecified essential hypertension   . S/P colonoscopy Jan 2012    anal papilla, internal hemorrhoids, melanosis coli    Past Surgical History  Procedure Date  . None     No  current outpatient prescriptions on file.    Allergies as of 05/15/2011  . (No Known Allergies)    Family History  Problem Relation Age of Onset  . Colon cancer Neg Hx     History   Social History  . Marital Status: Single    Spouse Name: N/A    Number of Children: N/A  . Years of Education: N/A   Social History Main Topics  . Smoking status: Never Smoker   . Smokeless tobacco: Not on file  . Alcohol Use: No  . Drug Use: Not on file  . Sexually Active: Not on file     Review of Systems: Gen: Denies fever, chills, anorexia. Denies fatigue, weakness, weight loss.  CV: Denies chest pain, palpitations, syncope, peripheral edema, and claudication. Resp: Denies dyspnea at rest, cough, wheezing, coughing up blood, and pleurisy. GI: Denies vomiting blood, jaundice, and fecal incontinence.   Denies dysphagia or odynophagia. Derm: Denies rash, itching, dry skin Psych: Denies depression, anxiety, memory loss, confusion. No homicidal or suicidal ideation.  Heme: Denies bruising, bleeding, and enlarged lymph nodes.  Physical Exam: BP 132/88  Pulse 90  Temp(Src) 97.8 F (36.6 C) (Temporal)  Ht 5\' 5"  (1.651 m)  Wt 138 lb (62.596 kg)  BMI 22.96 kg/m2 General:   Alert and oriented. No distress noted. Pleasant and cooperative.  Head:  Normocephalic and atraumatic. Eyes:  Conjuctiva clear without scleral icterus. Mouth:  Oral mucosa pink and moist. No lesions. Neck:  Supple, without mass or  thyromegaly. Heart:  S1, S2 present without murmurs, rubs, or gallops. Regular rate and rhythm. Abdomen:  +BS, soft, non-tender and non-distended. No rebound or guarding. No HSM or masses noted. Msk:  Symmetrical without gross deformities. Normal posture. Extremities:  Without edema. Neurologic:  Alert and  oriented x4;  grossly normal neurologically. Skin:  Intact without significant lesions or rashes. Cervical Nodes:  No significant cervical adenopathy. Psych:  Alert and cooperative.  Normal mood and affect.

## 2011-05-19 ENCOUNTER — Encounter: Payer: Self-pay | Admitting: Gastroenterology

## 2011-05-19 DIAGNOSIS — D649 Anemia, unspecified: Secondary | ICD-10-CM | POA: Insufficient documentation

## 2011-05-19 DIAGNOSIS — R748 Abnormal levels of other serum enzymes: Secondary | ICD-10-CM | POA: Insufficient documentation

## 2011-05-19 NOTE — Assessment & Plan Note (Signed)
Chronic hx of elevated LFTs, specifically AP in the past. Most recent AP 358, July 2012. Albumin low. Otherwise normal. Please see HPI for full details of work-up in past. CT with findings of increased liver size since 2006, unable to entirely exclude cirrhosis. Pt has been quite sensitive to statins, resulting in elevated liver enzymes and CK in the past. Was counseled to avoid this class in the past. On Pravachol now from what I can tell. Unable to exclude other etiology at this time. Will proceed with labs, hold Pravachol for now, further work-up determined by patient's response and lab results.  HBsAg, Hep C antibody, Immunoglobulins, AMA, ANA, ASMA. PT/INR. HFP

## 2011-05-19 NOTE — Assessment & Plan Note (Signed)
77 year old female with anemia, heme + stools. Updated colonoscopy on file. Completely asymptomatic at this time. No abdominal pain, melena, brbpr. No loss of appetite or wt loss. No change in bowel habits. Normocytic anemia noted on recent labs. Hgb 9.6. Iron low, ferritin normal. Question of multifactorial etiologies to anemia to include early iron deficiency vs anemia of chronic disease. Upper GI tract has not been evaluated. Recent colonoscopy Jan 2012. Will likely benefit from upper EGD, and this was discussed with pt. Will obtain updated CBC as well to compare. Discuss with Dr. Darrick Penna EGD.

## 2011-05-20 ENCOUNTER — Telehealth: Payer: Self-pay | Admitting: Gastroenterology

## 2011-05-20 NOTE — Telephone Encounter (Signed)
Called, many rings and no answer.

## 2011-05-20 NOTE — Progress Notes (Signed)
Cc to PCP 

## 2011-05-20 NOTE — Progress Notes (Signed)
Proceed with EGD. If normal, and pt will need a capsule placed on the day of endoscopy. Needs a 1 hour time slot.

## 2011-05-20 NOTE — Telephone Encounter (Signed)
Please have pt stop pravachol. Needs another alternative for cholesterol medication. Please copy the office visit note to PCP and make sure they are aware of the need to change pravachol to another agent (not a statin).   Needs to be set up for an upper endoscopy with Dr. Darrick Penna. 1 hour time slot. Possible capsule placement. Please let pt know this is the plan. We already discussed this possibility at her office visit.

## 2011-05-21 NOTE — Telephone Encounter (Signed)
Pt is resident at [facility] presently for rehab. She was unavailable. Spoke to her nurse, Gwenyth Allegra, LPN. She requested I fax the information over to [phone]. Printed and faxed the info.

## 2011-05-22 ENCOUNTER — Other Ambulatory Visit: Payer: Self-pay | Admitting: General Practice

## 2011-05-22 ENCOUNTER — Encounter: Payer: Self-pay | Admitting: General Practice

## 2011-05-22 DIAGNOSIS — D649 Anemia, unspecified: Secondary | ICD-10-CM

## 2011-05-22 NOTE — Telephone Encounter (Signed)
I called Marchelle Folks at [facility] to communicate appt.  Lm with receptionist.

## 2011-05-22 NOTE — Telephone Encounter (Signed)
Pt is scheduled for egd with poss givens placement 06/13/2011@11 :00am

## 2011-06-13 MED ORDER — SODIUM CHLORIDE 0.45 % IV SOLN
Freq: Once | INTRAVENOUS | Status: AC
  Administered 2011-06-16: 09:00:00 via INTRAVENOUS

## 2011-06-16 ENCOUNTER — Telehealth: Payer: Self-pay | Admitting: Gastroenterology

## 2011-06-16 ENCOUNTER — Encounter (HOSPITAL_COMMUNITY)
Admission: RE | Disposition: A | Discharge status: Home / Self Care | Payer: Self-pay | Source: Ambulatory Visit | Attending: Gastroenterology

## 2011-06-16 ENCOUNTER — Other Ambulatory Visit: Payer: Self-pay | Admitting: Gastroenterology

## 2011-06-16 ENCOUNTER — Encounter (HOSPITAL_COMMUNITY): Payer: Self-pay | Admitting: *Deleted

## 2011-06-16 ENCOUNTER — Ambulatory Visit (HOSPITAL_COMMUNITY)
Admission: RE | Admit: 2011-06-16 | Discharge: 2011-06-16 | Disposition: A | Discharge status: Home / Self Care | Payer: Medicare Other | Source: Ambulatory Visit | Attending: Gastroenterology | Admitting: Gastroenterology

## 2011-06-16 DIAGNOSIS — K2970 Gastritis, unspecified, without bleeding: Secondary | ICD-10-CM

## 2011-06-16 DIAGNOSIS — K921 Melena: Secondary | ICD-10-CM | POA: Insufficient documentation

## 2011-06-16 DIAGNOSIS — K319 Disease of stomach and duodenum, unspecified: Secondary | ICD-10-CM | POA: Insufficient documentation

## 2011-06-16 DIAGNOSIS — I10 Essential (primary) hypertension: Secondary | ICD-10-CM | POA: Insufficient documentation

## 2011-06-16 DIAGNOSIS — D649 Anemia, unspecified: Secondary | ICD-10-CM

## 2011-06-16 DIAGNOSIS — K2990 Gastroduodenitis, unspecified, without bleeding: Secondary | ICD-10-CM | POA: Insufficient documentation

## 2011-06-16 HISTORY — DX: Hyperlipidemia, unspecified: E78.5

## 2011-06-16 HISTORY — PX: ESOPHAGOGASTRODUODENOSCOPY: SHX5428

## 2011-06-16 SURGERY — EGD (ESOPHAGOGASTRODUODENOSCOPY)
Anesthesia: Moderate Sedation

## 2011-06-16 MED ORDER — MEPERIDINE HCL 100 MG/ML IJ SOLN
INTRAMUSCULAR | Status: DC | PRN
  Administered 2011-06-16: 25 mg via INTRAVENOUS

## 2011-06-16 MED ORDER — MIDAZOLAM HCL 5 MG/5ML IJ SOLN
INTRAMUSCULAR | Status: AC
  Filled 2011-06-16: qty 10

## 2011-06-16 MED ORDER — STERILE WATER FOR IRRIGATION IR SOLN
Status: DC | PRN
  Administered 2011-06-16: 10:00:00

## 2011-06-16 MED ORDER — HYDRALAZINE HCL 20 MG/ML IJ SOLN
INTRAMUSCULAR | Status: AC
  Filled 2011-06-16: qty 1

## 2011-06-16 MED ORDER — MIDAZOLAM HCL 5 MG/5ML IJ SOLN
INTRAMUSCULAR | Status: DC | PRN
  Administered 2011-06-16: 2 mg via INTRAVENOUS

## 2011-06-16 MED ORDER — OMEPRAZOLE 20 MG PO CPDR: DELAYED_RELEASE_CAPSULE | ORAL | Status: DC

## 2011-06-16 MED ORDER — MEPERIDINE HCL 100 MG/ML IJ SOLN
INTRAMUSCULAR | Status: AC
  Filled 2011-06-16: qty 1

## 2011-06-16 MED ORDER — HYDRALAZINE HCL 20 MG/ML IJ SOLN
INTRAMUSCULAR | Status: DC | PRN
  Administered 2011-06-16: 10 mg via INTRAVENOUS

## 2011-06-16 NOTE — Interval H&P Note (Signed)
History and Physical Interval Note:   06/16/2011   9:31 AM   [name]  has presented today for surgery, with the diagnosis of ANEMIA  The various methods of treatment have been discussed with the patient and family. After consideration of risks, benefits and other options for treatment, the patient has consented to  Procedure(s): ESOPHAGOGASTRODUODENOSCOPY (EGD) GIVENS CAPSULE STUDY as a surgical intervention .  I have reviewed the patients' chart and labs.  Questions were answered to the patient's satisfaction.     Brianna Eva  MD

## 2011-06-16 NOTE — H&P (Signed)
Current Vitals       Recorded User        05/15/2011  1:35 PM  Evalee Mutton, LPN           BP Pulse Temp (Src) Resp Ht Wt    132/88  90  97.8 F (36.6 C) (Temporal)  N/A  5\' 5"  (1.651 m)  138 lb (62.596 kg)       BMI SpO2 PF LMP    22.96 kg/m2  N/A  N/A  N/A          Progress Notes     Gerrit Halls, NP  05/19/2011  5:45 PM  Signed   Referring Provider: Avon Gully, MD Primary Care Physician:  Avon Gully, MD    Chief Complaint   Patient presents with   .  positive hemoccult      HPI:    Ms. Friedt is a very pleasant 77 year old woman who presents today at the request of Dr. Felecia Shelling secondary to heme + stools and anemia. She actually was admitted to [facility] on July 3 after a fall. Admitted with dehydration, UTI, rhabdomyolysis. Has hx of chronically elevated alk phos since 1982. Work-up in past includes liver biopsy X 2. Findings on first one was portal inflammation, second was non-specific portal changes, copper stains negative. Seems to have elevated LFTs with infection and/or statins. Has had MRCP in past as well with finding os ductal dilation but no stones. This was in 2010. Question of proceeding with EUS if needed. Last AP on file July 2012 was 358, Albumin low at 1.9.   CT July 2012 with slightly lobulated and increased size of liver vs previous study (2006) can't entirely exclude cirrhosis, no definite nodularity identified.   CBC July 24: normocytic anemia with Hgb 9.6, Hct 29.2. Iron low at 29, Ferritin normal 165, GGT elevated 94, Sed rate elevated at 40.   She is completely asymptomatic. She denies abdominal pain. Wt is stable, with reports of ranging anywhere from 130-145. Denies any lack of appetite. No N/V. No dysphagia. Denies reflux. BM every day. No melena or brbpr. No fever, chills, jaundice. Last colonoscopy Jan of this year (2012). Anal papilla, internal hemorrhoids, melanosis coli.       Past Medical History   Diagnosis  Date   .  Memory loss     .   Leukopenia     .  Hyponatremia     .  Other and unspecified hyperlipidemia     .  Proteinuria     .  Edema     .  Unspecified asthma, with exacerbation     .  Arthropathy, unspecified, site unspecified     .  Unspecified essential hypertension     .  S/P colonoscopy  Jan 2012       anal papilla, internal hemorrhoids, melanosis coli       Past Surgical History   Procedure  Date   .  None         No current outpatient prescriptions on file.       Allergies as of 05/15/2011   .  (No Known Allergies)       Family History   Problem  Relation  Age of Onset   .  Colon cancer  Neg Hx         History       Social History   .  Marital Status:  Single       Spouse Name:  N/A  Number of Children:  N/A   .  Years of Education:  N/A       Social History Main Topics   .  Smoking status:  Never Smoker    .  Smokeless tobacco:  Not on file   .  Alcohol Use:  No   .  Drug Use:  Not on file   .  Sexually Active:  Not on file          Review of Systems: Gen: Denies fever, chills, anorexia. Denies fatigue, weakness, weight loss.   CV: Denies chest pain, palpitations, syncope, peripheral edema, and claudication. Resp: Denies dyspnea at rest, cough, wheezing, coughing up blood, and pleurisy. GI: Denies vomiting blood, jaundice, and fecal incontinence.   Denies dysphagia or odynophagia. Derm: Denies rash, itching, dry skin Psych: Denies depression, anxiety, memory loss, confusion. No homicidal or suicidal ideation.   Heme: Denies bruising, bleeding, and enlarged lymph nodes.   Physical Exam: BP 132/88  Pulse 90  Temp(Src) 97.8 F (36.6 C) (Temporal)  Ht 5\' 5"  (1.651 m)  Wt 138 lb (62.596 kg)  BMI 22.96 kg/m2 General:   Alert and oriented. No distress noted. Pleasant and cooperative.   Head:  Normocephalic and atraumatic. Eyes:  Conjuctiva clear without scleral icterus. Mouth:  Oral mucosa pink and moist. No lesions. Neck:  Supple, without mass or  thyromegaly. Heart:  S1, S2 present without murmurs, rubs, or gallops. Regular rate and rhythm. Abdomen:  +BS, soft, non-tender and non-distended. No rebound or guarding. No HSM or masses noted. Msk:  Symmetrical without gross deformities. Normal posture. Extremities:  Without edema. Neurologic:  Alert and  oriented x4;  grossly normal neurologically. Skin:  Intact without significant lesions or rashes. Cervical Nodes:  No significant cervical adenopathy. Psych:  Alert and cooperative. Normal mood and affect.     Glendora Score  05/20/2011  7:58 AM  Signed Cc to PCP  Jonette Eva, MD  05/20/2011  8:13 AM  Signed Proceed with EGD. If normal, and pt will need a capsule placed on the day of endoscopy. Needs a 1 hour time slot.        Anemia - Gerrit Halls, NP  05/19/2011  5:40 PM  Signed 77 year old female with anemia, heme + stools. Updated colonoscopy on file. Completely asymptomatic at this time. No abdominal pain, melena, brbpr. No loss of appetite or wt loss. No change in bowel habits. Normocytic anemia noted on recent labs. Hgb 9.6. Iron low, ferritin normal. Question of multifactorial etiologies to anemia to include early iron deficiency vs anemia of chronic disease. Upper GI tract has not been evaluated. Recent colonoscopy Jan 2012. Will likely benefit from upper EGD, and this was discussed with pt. Will obtain updated CBC as well to compare. Discuss with Dr. Darrick Penna EGD.    Alkaline phosphatase elevation - Gerrit Halls, NP  05/19/2011  5:44 PM  Signed Chronic hx of elevated LFTs, specifically AP in the past. Most recent AP 358, July 2012. Albumin low. Otherwise normal. Please see HPI for full details of work-up in past. CT with findings of increased liver size since 2006, unable to entirely exclude cirrhosis. Pt has been quite sensitive to statins, resulting in elevated liver enzymes and CK in the past. Was counseled to avoid this class in the past. On Pravachol now from what I can tell. Unable to  exclude other etiology at this time. Will proceed with labs, hold Pravachol for now, further work-up determined by patient's response and lab results.  HBsAg, Hep C antibody, Immunoglobulins, AMA, ANA, ASMA. PT/INR. HFP

## 2011-06-16 NOTE — OR Nursing (Signed)
Pt's B/P elevated; per verbal order Dr Darrick Penna; administer 10 mg hydralazine iv.  Done.

## 2011-06-16 NOTE — OR Nursing (Signed)
Patient scheduled for an EGD and possible Given's Capsule Placement. Patient stated that she only wanted the light ran, "I'm almost 77 years old and I don't think all of that is necessary." Dr. Darrick Penna notified and will proceed with EGD.

## 2011-06-16 NOTE — Telephone Encounter (Signed)
I don't see where she had labs done as requested. Can we please follow-up on this?   Needs:HBsAg, Hep C antibody, Immunoglobulins, AMA, ANA, ASMA. PT/INR. HFP Important we get these soon.

## 2011-06-17 ENCOUNTER — Other Ambulatory Visit: Payer: Self-pay

## 2011-06-17 ENCOUNTER — Telehealth: Payer: Self-pay | Admitting: Gastroenterology

## 2011-06-17 NOTE — Telephone Encounter (Signed)
Was given a phone number of pt's niece, [phone], to reach pt. ( Got this number from the funeral home). Called, [facility] for a return call.

## 2011-06-17 NOTE — Telephone Encounter (Signed)
Pt returned call. Said she had labs done when she was in the hospital , and she can't remember any more. Said she saw Dr. Darrick Penna at the hospital yesterday and she didn't mention any more labs.Please advise!

## 2011-06-17 NOTE — Telephone Encounter (Signed)
She didn't have the labs done that we need. See list of labs needed. She needs to have this done so we may review for any liver issues. Thanks!

## 2011-06-18 NOTE — Telephone Encounter (Signed)
Pt aware she needs labs. I mailed the lab orders to her niece who she is living with at this time. % [facility], [address], [place], [place] [address]. ( Per AS, ok to mail the labs that were printed for 05/15/2011).

## 2011-06-21 LAB — BASIC METABOLIC PANEL
BUN: 24 mg/dL — ABNORMAL HIGH (ref 6–23)
Calcium: 9.8 mg/dL (ref 8.4–10.5)
Potassium: 4.3 mEq/L (ref 3.5–5.3)
Sodium: 138 mEq/L (ref 135–145)

## 2011-06-21 LAB — CBC WITH DIFFERENTIAL/PLATELET
Basophils Absolute: 0 10*3/uL (ref 0.0–0.1)
Basophils Relative: 0 % (ref 0–1)
Eosinophils Relative: 1 % (ref 0–5)
HCT: 36.5 % (ref 36.0–46.0)
MCHC: 33.7 g/dL (ref 30.0–36.0)
MCV: 98.1 fL (ref 78.0–100.0)
Monocytes Absolute: 0.2 10*3/uL (ref 0.1–1.0)
RDW: 14.2 % (ref 11.5–15.5)

## 2011-06-21 LAB — PROTIME-INR: INR: 0.97 (ref <1.50)

## 2011-06-21 LAB — HEPATIC FUNCTION PANEL
ALT: 10 U/L (ref 0–35)
Indirect Bilirubin: 0.4 mg/dL (ref 0.0–0.9)
Total Protein: 7.4 g/dL (ref 6.0–8.3)

## 2011-06-21 LAB — IGG, IGA, IGM: IgG (Immunoglobin G), Serum: 1280 mg/dL (ref 690–1700)

## 2011-06-23 LAB — ANA: Anti Nuclear Antibody(ANA): NEGATIVE

## 2011-06-24 LAB — MITOCHONDRIAL ANTIBODIES: Mitochondrial M2 Ab, IgG: 0.39 (ref <0.91)

## 2011-06-25 ENCOUNTER — Encounter (HOSPITAL_COMMUNITY): Payer: Self-pay | Admitting: Gastroenterology

## 2011-06-25 ENCOUNTER — Telehealth: Payer: Self-pay | Admitting: Gastroenterology

## 2011-06-25 NOTE — Telephone Encounter (Signed)
Pt is aware of OV for 12/12 at 0930 with SF

## 2011-06-25 NOTE — Telephone Encounter (Signed)
Results Cc to PCP  

## 2011-06-25 NOTE — Telephone Encounter (Signed)
Please call pt. His stomach Bx shows mild gastritis. Continue OMP EVERY DAY. MAY RESTART ASPIRIN ON 9/25. CONTINUE OMP as long as she is taking aspirin. OPV DEC 2012, dX: ANEMIA, HEME POS STOOLS AND WILL NEED HER BLOOD COUNT CHECKED.

## 2011-06-26 NOTE — Telephone Encounter (Signed)
Pt informed of results.

## 2011-06-30 NOTE — Progress Notes (Signed)
Quick Note:  LMOM at [facility] for a return call. ______

## 2011-07-01 NOTE — Progress Notes (Signed)
Quick Note:  Received call from pt's niece, said [name] said she was not going to call our office. She told me pt was at the funeral home now. I call pt at [phone]. She answered and I informed her of the results and info. She said she is not taking Pravachol. She hung up quickly. I will route to [name] so she can send info to [name]. ______

## 2011-07-02 NOTE — Progress Notes (Signed)
Results Cc to Dr. Fanta 

## 2011-07-23 NOTE — Discharge Summary (Signed)
[name], KREISCHER NO.:  [id number]  MEDICAL RECORD NO.:  [id number]  LOCATION:  A337                          FACILITY:  [facility]  PHYSICIAN:  Purcell Nails, MD DATE OF BIRTH:  30-Oct-1921  DATE OF ADMISSION:  04/08/2011 DATE OF DISCHARGE:  07/10/2012LH                              DISCHARGE SUMMARY   DISCHARGE DIAGNOSES: 1. Urinary tract infection. 2. Hypotension. 3. Rhabdomyolysis. 4. Hyperlipidemia.  DISCHARGE MEDICATIONS:  Pravastatin, ramipril, ceftriaxone, Actonel, and Diflucan.  DISPOSITION:  The patient was discharged to nursing home in a stable condition.  HOSPITAL COURSE:  This is an 77 year old female patient with multiple medical problems, came in with complaint of urinary tract infection, was found to have rhabdomyolysis and dehydration with acute renal failure. She was treated in-house with IV antibiotics involving Rocephin.  She was supported with normal saline.  Her kidney function improved and subsequently was transferred to [facility] in a stable condition.  Her medications were revised and continued.  Her urine culture taken that day of admission did not show any growth. On the day of discharge, her electrolytes showed normal findings. Sodium 138, potassium 4.0, chloride 109, bicarb 22, BUN 17, creatinine improved from 2.11 to 0.75.  Her care was to be followed in the nursing home by Dr. Felecia Shelling.                                           ______________________________ Purcell Nails, MD     GN/MEDQ  D:  07/22/2011  T:  07/23/2011  Job:  [id number]

## 2011-08-11 ENCOUNTER — Other Ambulatory Visit (HOSPITAL_COMMUNITY): Payer: Self-pay | Admitting: Internal Medicine

## 2011-08-11 DIAGNOSIS — Z139 Encounter for screening, unspecified: Secondary | ICD-10-CM

## 2011-08-12 ENCOUNTER — Ambulatory Visit (HOSPITAL_COMMUNITY)
Admission: RE | Admit: 2011-08-12 | Discharge: 2011-08-12 | Disposition: A | Discharge status: Home / Self Care | Payer: Medicare Other | Source: Ambulatory Visit | Attending: Internal Medicine | Admitting: Internal Medicine

## 2011-08-12 DIAGNOSIS — Z139 Encounter for screening, unspecified: Secondary | ICD-10-CM

## 2011-08-12 DIAGNOSIS — Z1231 Encounter for screening mammogram for malignant neoplasm of breast: Secondary | ICD-10-CM | POA: Insufficient documentation

## 2011-09-17 ENCOUNTER — Encounter: Payer: Self-pay | Admitting: Orthopedic Surgery

## 2011-09-17 ENCOUNTER — Ambulatory Visit (INDEPENDENT_AMBULATORY_CARE_PROVIDER_SITE_OTHER): Payer: Medicare Other | Admitting: Orthopedic Surgery

## 2011-09-17 ENCOUNTER — Ambulatory Visit: Payer: Medicare Other | Admitting: Gastroenterology

## 2011-09-17 VITALS — BP 160/70 | Ht 65.0 in | Wt 138.0 lb

## 2011-09-17 DIAGNOSIS — M1710 Unilateral primary osteoarthritis, unspecified knee: Secondary | ICD-10-CM

## 2011-09-17 NOTE — Progress Notes (Signed)
X-ray report [facility] lateral and axial view right and left knee  Findings: The knee is in valgus alignment. The lateral joint space is completely obliterated. The patellofemoral joint is normally aligned with mild arthritis. Osteophytes subchondral sclerosis and cysts are noted. Both knees have similar findings.  Impression bilateral osteoarthritis with valgus position.

## 2011-09-17 NOTE — Progress Notes (Signed)
Chief complaint: Bilateral knee pain right greater than left HPI:(4) 89 years complains a 6 month history of right greater than left bilateral knee pain but minimal functional deficit although she has fallen once. She denies the fall says she just slipped down on the floor. However she require rehabilitation stay now requires a walker and is complaining of bilateral knee pain. She is now living with a niece. Dr. Felecia Shelling has asked [place] for evaluation.  ROS:(2) see recorded data in scanned documents review completed   PFSH: (1)  Past Medical History  Diagnosis Date  . Memory loss   . Leukopenia   . Hyponatremia   . Other and unspecified hyperlipidemia   . Proteinuria   . Edema   . Unspecified asthma, with exacerbation   . Arthropathy, unspecified, site unspecified   . Unspecified essential hypertension   . S/P colonoscopy Jan 2012    anal papilla, internal hemorrhoids, melanosis coli  . Hyperlipidemia      Physical Exam(12) GENERAL: normal development   CDV: pulses are normal   Skin: normal  Lymph: nodes were not palpable/normal  Psychiatric: awake, alert and oriented  Neuro: normal sensation  MSK she ambulates with a walker and has bilateral valgus knees right worse than left 1 the right knee is in valgus the range of motion is 110 the strength is normal the stability is normal the lateral compartment is tender 2 the left knee is similar with valgus position, range of motion 105, strength normal, stability normal, lateral compartment tender.   Imaging: Bilateral knee films show bilateral valgus arthritis end-stage.  Assessment: Osteoarthritis both knees this is valgus osteoarthritis, however I do not think the patient is a surgical candidate and I would manage her with pain medication. If she does want to have surgery then she will need to go to a joint specialist at a tertiary care facility because of her age medical problems and need for bilateral surgery to get both knees  done and wants    Plan: I'll talk to the family and to Dr. Felecia Shelling. This patient says she has no medical problems. She is a poor historian. She seems to have early Alzheimer's dementia to me. I don't think she is a good surgical candidate.

## 2011-09-17 NOTE — Patient Instructions (Signed)
Continue hydrocodone for pain

## 2011-09-18 ENCOUNTER — Encounter: Payer: Self-pay | Admitting: Orthopedic Surgery

## 2011-09-18 DIAGNOSIS — M1710 Unilateral primary osteoarthritis, unspecified knee: Secondary | ICD-10-CM | POA: Insufficient documentation

## 2011-09-25 ENCOUNTER — Encounter: Payer: Self-pay | Admitting: Gastroenterology

## 2011-09-25 ENCOUNTER — Ambulatory Visit (INDEPENDENT_AMBULATORY_CARE_PROVIDER_SITE_OTHER): Payer: Medicare Other | Admitting: Gastroenterology

## 2011-09-25 DIAGNOSIS — D649 Anemia, unspecified: Secondary | ICD-10-CM

## 2011-09-25 DIAGNOSIS — R748 Abnormal levels of other serum enzymes: Secondary | ICD-10-CM

## 2011-09-25 MED ORDER — OMEPRAZOLE 20 MG PO CPDR: DELAYED_RELEASE_CAPSULE | ORAL | Status: DC

## 2011-09-25 NOTE — Assessment & Plan Note (Signed)
ALK PHOS 134 SEP 2012  CHRONIC MILD ELEVATION WITH EXTENSIVE WORKUP IN THE PAST. RE-CHECK HFP IN ONE YEAR. NO NEED FOR ADDITIONAL GI WORKUP.

## 2011-09-25 NOTE — Progress Notes (Signed)
Cc to PCP 

## 2011-09-25 NOTE — Patient Instructions (Signed)
Take omeprazole every day. Follow up in 6 mos.

## 2011-09-25 NOTE — Assessment & Plan Note (Addendum)
Resolved.  NO ADDITIONAL GI WORK UP NEEDED. RE-CHECK CBC IN 6 MOS. OBTAIN LABS DRAWN BY DR. FANTA.

## 2011-09-25 NOTE — Progress Notes (Signed)
  Subjective:    Patient ID: [name], female    DOB: 26-Mar-1922, 77 y.o.   MRN: [id number] PCP: Felecia Shelling  HPI PT LAST SEEN FOR ANEMIA, HEME POS STOOLS, ELEVALTED ALK PHOS. EGD SEP 2012-PRIOR PUD, GASTRITIS. REPEAT HB 12.3 AND ALK PHO 143. NO ACTIVE GIB OR COMPLAINTS. TKIGN AS AND OMP. HAS A WORKUP PENDING FOR BIL KNEE PAIN  Past Medical History  Diagnosis Date  . Memory loss   . Leukopenia   . Hyponatremia   . Other and unspecified hyperlipidemia   . Proteinuria   . Edema   . Unspecified asthma, with exacerbation   . Arthropathy, unspecified, site unspecified   . Unspecified essential hypertension   . S/P colonoscopy Jan 2012    anal papilla, internal hemorrhoids, melanosis coli  . Hyperlipidemia     Past Surgical History  Procedure Date  . None   . Esophagogastroduodenoscopy 06/16/2011    Procedure: ESOPHAGOGASTRODUODENOSCOPY (EGD);  Surgeon: Arlyce Harman, MD;  Location: [facility];  Service: Endoscopy;  Laterality: N/A;  11:00AM    No Known Allergies  Current Outpatient Prescriptions  Medication Sig Dispense Refill  . acetaminophen (TYLENOL) 325 MG tablet Take 650 mg by mouth every 6 (six) hours as needed.      Marland Kitchen amLODipine (NORVASC) 5 MG tablet Take 1 tablet (5 mg total) by mouth daily.    . Colesevelam HCl (WELCHOL PO) Take by mouth.      . furosemide (LASIX) 40 MG tablet Take 1 tablet (40 mg total) by mouth 2 (two) times daily. Take one tablet by mouth every 4 days as needed for fluid    . HYDROCODONE-ACETAMINOPHEN PO Take by mouth.      . IRON PO Take by mouth.      Marland Kitchen omeprazole (PRILOSEC) 20 MG capsule 1 po every morning    . ramipril (ALTACE) 10 MG capsule Take 1 capsule (10 mg total) by mouth daily.        Review of Systems     Objective:   Physical Exam  Vitals reviewed. Constitutional: She is oriented to person, place, and time. She appears well-developed and well-nourished.  HENT:  Head: Normocephalic and atraumatic.  Cardiovascular: Normal  rate, regular rhythm and normal heart sounds.   Pulmonary/Chest: Effort normal and breath sounds normal. No respiratory distress.  Abdominal: Soft. Bowel sounds are normal. She exhibits no distension. There is no tenderness.  Musculoskeletal:       WALKING WITH A WALKER DUE TO KNEE PAIN  Neurological: She is alert and oriented to person, place, and time.       NO FOCAL DEFICITS   Psychiatric: She has a normal mood and affect.          Assessment & Plan:

## 2011-10-01 NOTE — Telephone Encounter (Signed)
Error

## 2011-10-08 NOTE — Progress Notes (Signed)
Reminder in epic to have CBC re-checked in 6 months and to have HFP re-checked in one year

## 2011-11-05 DIAGNOSIS — I70209 Unspecified atherosclerosis of native arteries of extremities, unspecified extremity: Secondary | ICD-10-CM | POA: Diagnosis not present

## 2012-01-14 DIAGNOSIS — I70209 Unspecified atherosclerosis of native arteries of extremities, unspecified extremity: Secondary | ICD-10-CM | POA: Diagnosis not present

## 2012-02-23 DIAGNOSIS — M1710 Unilateral primary osteoarthritis, unspecified knee: Secondary | ICD-10-CM | POA: Diagnosis not present

## 2012-03-05 ENCOUNTER — Telehealth: Payer: Self-pay | Admitting: Gastroenterology

## 2012-03-05 ENCOUNTER — Other Ambulatory Visit: Payer: Self-pay

## 2012-03-05 DIAGNOSIS — D649 Anemia, unspecified: Secondary | ICD-10-CM

## 2012-03-05 NOTE — Telephone Encounter (Signed)
June recall list has that patient is due to recheck CBC in 6 months

## 2012-03-05 NOTE — Telephone Encounter (Signed)
Lab order mailed to pt.

## 2012-03-10 ENCOUNTER — Encounter: Payer: Self-pay | Admitting: Gastroenterology

## 2012-03-10 ENCOUNTER — Telehealth: Payer: Self-pay | Admitting: Gastroenterology

## 2012-03-10 ENCOUNTER — Other Ambulatory Visit: Payer: Self-pay

## 2012-03-10 DIAGNOSIS — D649 Anemia, unspecified: Secondary | ICD-10-CM

## 2012-03-10 NOTE — Telephone Encounter (Signed)
Lab order was mailed to pt on 03/05/2012. ( I did not send the Duplicate that I just printed for 03/25/2012.

## 2012-03-10 NOTE — Telephone Encounter (Signed)
June recall list has that pt needs to repeat CBC and OV to be made.

## 2012-03-24 DIAGNOSIS — I70209 Unspecified atherosclerosis of native arteries of extremities, unspecified extremity: Secondary | ICD-10-CM | POA: Diagnosis not present

## 2012-04-20 DIAGNOSIS — M1990 Unspecified osteoarthritis, unspecified site: Secondary | ICD-10-CM | POA: Diagnosis not present

## 2012-04-20 DIAGNOSIS — I10 Essential (primary) hypertension: Secondary | ICD-10-CM | POA: Diagnosis not present

## 2012-04-26 DIAGNOSIS — Z79899 Other long term (current) drug therapy: Secondary | ICD-10-CM | POA: Diagnosis not present

## 2012-04-26 DIAGNOSIS — G894 Chronic pain syndrome: Secondary | ICD-10-CM | POA: Diagnosis not present

## 2012-04-26 DIAGNOSIS — G8929 Other chronic pain: Secondary | ICD-10-CM | POA: Diagnosis not present

## 2012-04-27 ENCOUNTER — Encounter: Payer: Self-pay | Admitting: Gastroenterology

## 2012-04-28 ENCOUNTER — Encounter: Payer: Self-pay | Admitting: Gastroenterology

## 2012-04-28 ENCOUNTER — Ambulatory Visit (INDEPENDENT_AMBULATORY_CARE_PROVIDER_SITE_OTHER): Payer: Medicare Other | Admitting: Gastroenterology

## 2012-04-28 VITALS — BP 140/80 | HR 75 | Temp 98.2°F | Ht 65.0 in | Wt 136.2 lb

## 2012-04-28 DIAGNOSIS — D649 Anemia, unspecified: Secondary | ICD-10-CM

## 2012-04-28 DIAGNOSIS — R748 Abnormal levels of other serum enzymes: Secondary | ICD-10-CM

## 2012-04-28 NOTE — Assessment & Plan Note (Signed)
ALK PHOS 143 SEP 2012. LABS PENDING FROM DR. FANTA.   HFP EVERY YEAR. OPV IN 1 YEAR

## 2012-04-28 NOTE — Progress Notes (Signed)
  Subjective:    Patient ID: [name], female    DOB: December 08, 1921, 78 y.o.   MRN: [id number]  PCP: FANTA  HPI SEEING DR. Eduard Clos. STILL HAVING KNEE PAIN. NO BLEEDING. APPETITE: TOO GOOD. NO PROBLEMS SWALLOWING. RECENTLY HAD BLOOD DRAWN.   Past Medical History  Diagnosis Date  . Memory loss   . Leukopenia   . Hyponatremia   . Other and unspecified hyperlipidemia   . Proteinuria   . Edema   . Unspecified asthma, with exacerbation   . Arthropathy, unspecified, site unspecified   . Unspecified essential hypertension   . S/P colonoscopy Jan 2012    anal papilla, internal hemorrhoids, melanosis coli  . Hyperlipidemia    Past Surgical History  Procedure Date  . None   . Esophagogastroduodenoscopy 06/16/2011    modreate gastritis  . Colonoscopy 11/05/2010    Anal papilla and internal hemorrhoids pigmented rectal mucosa consistent with melanosis coli/ Diffuse pigmentation of the colonic mucosa consistent with melanosis coli, localized cecal diverticula as described above    No Known Allergies  Current Outpatient Prescriptions  Medication Sig Dispense Refill  . acetaminophen (TYLENOL) 325 MG tablet Take 650 mg by mouth every 6 (six) hours as needed.        . Colesevelam HCl (WELCHOL PO) Take by mouth daily.       . furosemide (LASIX) 40 MG tablet Take 1 tablet (40 mg total) by mouth 2 (two) times daily. Take one tablet by mouth every 4 days as needed for fluid    . HYDROCODONE-ACETAMINOPHEN PO Take by mouth.     . IRON PO Take 325 mg by mouth daily.     Marland Kitchen omeprazole (PRILOSEC) 20 MG capsule 1 po every morning    . ramipril (ALTACE) 10 MG capsule Take 10 mg by mouth daily.    Marland Kitchen amLODipine (NORVASC) 5 MG tablet Take 1 tablet (5 mg total) by mouth daily.    .          Review of Systems     Objective:   Physical Exam  Vitals reviewed. Constitutional: She is oriented to person, place, and time. She appears well-nourished. No distress.  HENT:  Head: Normocephalic and  atraumatic.  Cardiovascular: Normal rate and regular rhythm.   Murmur heard. Pulmonary/Chest: Effort normal and breath sounds normal. No respiratory distress.  Abdominal: Soft. Bowel sounds are normal. She exhibits no distension. There is no tenderness.  Neurological: She is alert and oriented to person, place, and time.       NO  NEW FOCAL DEFICITS   Psychiatric: She has a normal mood and affect.          Assessment & Plan:

## 2012-04-28 NOTE — Assessment & Plan Note (Signed)
OBTAIN LABS FROM DR. FANTA.  OPV IN 1 YEAR.

## 2012-04-28 NOTE — Patient Instructions (Addendum)
I WILL GET YOUR LAB RESULTS FROM DR. FANTA.   YOU SHOULD HAVE YOUR BLOOD COUNT AND LIVER TESTS ONCE A YEAR.   FOLLOW UP IN ONE YEAR.

## 2012-04-28 NOTE — Progress Notes (Signed)
Faxed to PCP, Dr Fanta 

## 2012-05-03 NOTE — Progress Notes (Signed)
Reminder in epic to have HFP and OV in one year

## 2012-05-11 DIAGNOSIS — G894 Chronic pain syndrome: Secondary | ICD-10-CM | POA: Diagnosis not present

## 2012-05-11 DIAGNOSIS — M1710 Unilateral primary osteoarthritis, unspecified knee: Secondary | ICD-10-CM | POA: Diagnosis not present

## 2012-05-11 DIAGNOSIS — R269 Unspecified abnormalities of gait and mobility: Secondary | ICD-10-CM | POA: Diagnosis not present

## 2012-05-11 DIAGNOSIS — M25569 Pain in unspecified knee: Secondary | ICD-10-CM | POA: Diagnosis not present

## 2012-06-02 DIAGNOSIS — I70209 Unspecified atherosclerosis of native arteries of extremities, unspecified extremity: Secondary | ICD-10-CM | POA: Diagnosis not present

## 2012-06-08 DIAGNOSIS — R269 Unspecified abnormalities of gait and mobility: Secondary | ICD-10-CM | POA: Diagnosis not present

## 2012-06-08 DIAGNOSIS — G894 Chronic pain syndrome: Secondary | ICD-10-CM | POA: Diagnosis not present

## 2012-06-08 DIAGNOSIS — M25569 Pain in unspecified knee: Secondary | ICD-10-CM | POA: Diagnosis not present

## 2012-06-08 DIAGNOSIS — IMO0001 Reserved for inherently not codable concepts without codable children: Secondary | ICD-10-CM | POA: Diagnosis not present

## 2012-06-08 DIAGNOSIS — M1710 Unilateral primary osteoarthritis, unspecified knee: Secondary | ICD-10-CM | POA: Diagnosis not present

## 2012-06-15 DIAGNOSIS — M25569 Pain in unspecified knee: Secondary | ICD-10-CM | POA: Diagnosis not present

## 2012-06-15 DIAGNOSIS — G894 Chronic pain syndrome: Secondary | ICD-10-CM | POA: Diagnosis not present

## 2012-06-15 DIAGNOSIS — M1710 Unilateral primary osteoarthritis, unspecified knee: Secondary | ICD-10-CM | POA: Diagnosis not present

## 2012-06-22 DIAGNOSIS — M25569 Pain in unspecified knee: Secondary | ICD-10-CM | POA: Diagnosis not present

## 2012-06-22 DIAGNOSIS — G894 Chronic pain syndrome: Secondary | ICD-10-CM | POA: Diagnosis not present

## 2012-06-22 DIAGNOSIS — R269 Unspecified abnormalities of gait and mobility: Secondary | ICD-10-CM | POA: Diagnosis not present

## 2012-06-22 DIAGNOSIS — M1710 Unilateral primary osteoarthritis, unspecified knee: Secondary | ICD-10-CM | POA: Diagnosis not present

## 2012-07-19 DIAGNOSIS — M7989 Other specified soft tissue disorders: Secondary | ICD-10-CM | POA: Diagnosis not present

## 2012-07-19 DIAGNOSIS — M25569 Pain in unspecified knee: Secondary | ICD-10-CM | POA: Diagnosis not present

## 2012-07-19 DIAGNOSIS — R609 Edema, unspecified: Secondary | ICD-10-CM | POA: Diagnosis not present

## 2012-07-19 DIAGNOSIS — IMO0001 Reserved for inherently not codable concepts without codable children: Secondary | ICD-10-CM | POA: Diagnosis not present

## 2012-07-19 DIAGNOSIS — M1710 Unilateral primary osteoarthritis, unspecified knee: Secondary | ICD-10-CM | POA: Diagnosis not present

## 2012-07-19 DIAGNOSIS — G894 Chronic pain syndrome: Secondary | ICD-10-CM | POA: Diagnosis not present

## 2012-07-20 DIAGNOSIS — Z23 Encounter for immunization: Secondary | ICD-10-CM | POA: Diagnosis not present

## 2012-07-20 DIAGNOSIS — I10 Essential (primary) hypertension: Secondary | ICD-10-CM | POA: Diagnosis not present

## 2012-07-20 DIAGNOSIS — M1990 Unspecified osteoarthritis, unspecified site: Secondary | ICD-10-CM | POA: Diagnosis not present

## 2012-07-23 ENCOUNTER — Other Ambulatory Visit (HOSPITAL_COMMUNITY): Payer: Self-pay | Admitting: Internal Medicine

## 2012-07-23 DIAGNOSIS — Z139 Encounter for screening, unspecified: Secondary | ICD-10-CM

## 2012-08-12 ENCOUNTER — Ambulatory Visit (HOSPITAL_COMMUNITY): Payer: Medicare Other

## 2012-08-16 ENCOUNTER — Ambulatory Visit (HOSPITAL_COMMUNITY)
Admission: RE | Admit: 2012-08-16 | Discharge: 2012-08-16 | Disposition: A | Discharge status: Home / Self Care | Payer: Medicare Other | Source: Ambulatory Visit | Attending: Internal Medicine | Admitting: Internal Medicine

## 2012-08-16 DIAGNOSIS — Z139 Encounter for screening, unspecified: Secondary | ICD-10-CM

## 2012-08-16 DIAGNOSIS — Z1231 Encounter for screening mammogram for malignant neoplasm of breast: Secondary | ICD-10-CM | POA: Insufficient documentation

## 2012-08-23 DIAGNOSIS — M7989 Other specified soft tissue disorders: Secondary | ICD-10-CM | POA: Diagnosis not present

## 2012-08-23 DIAGNOSIS — M1710 Unilateral primary osteoarthritis, unspecified knee: Secondary | ICD-10-CM | POA: Diagnosis not present

## 2012-08-23 DIAGNOSIS — R269 Unspecified abnormalities of gait and mobility: Secondary | ICD-10-CM | POA: Diagnosis not present

## 2012-08-23 DIAGNOSIS — M25569 Pain in unspecified knee: Secondary | ICD-10-CM | POA: Diagnosis not present

## 2012-08-23 DIAGNOSIS — G894 Chronic pain syndrome: Secondary | ICD-10-CM | POA: Diagnosis not present

## 2012-08-31 DIAGNOSIS — I70209 Unspecified atherosclerosis of native arteries of extremities, unspecified extremity: Secondary | ICD-10-CM | POA: Diagnosis not present

## 2012-09-07 ENCOUNTER — Other Ambulatory Visit (HOSPITAL_COMMUNITY): Payer: Self-pay | Admitting: Internal Medicine

## 2012-09-07 DIAGNOSIS — Z139 Encounter for screening, unspecified: Secondary | ICD-10-CM

## 2012-09-13 ENCOUNTER — Ambulatory Visit (HOSPITAL_COMMUNITY): Payer: Medicare Other

## 2012-09-27 DIAGNOSIS — M1710 Unilateral primary osteoarthritis, unspecified knee: Secondary | ICD-10-CM | POA: Diagnosis not present

## 2012-10-17 ENCOUNTER — Other Ambulatory Visit: Payer: Self-pay | Admitting: Gastroenterology

## 2012-10-19 DIAGNOSIS — I10 Essential (primary) hypertension: Secondary | ICD-10-CM | POA: Diagnosis not present

## 2012-10-19 DIAGNOSIS — F0280 Dementia in other diseases classified elsewhere without behavioral disturbance: Secondary | ICD-10-CM | POA: Diagnosis not present

## 2012-10-19 DIAGNOSIS — M1990 Unspecified osteoarthritis, unspecified site: Secondary | ICD-10-CM | POA: Diagnosis not present

## 2012-11-02 DIAGNOSIS — Z961 Presence of intraocular lens: Secondary | ICD-10-CM | POA: Diagnosis not present

## 2012-11-09 DIAGNOSIS — I70209 Unspecified atherosclerosis of native arteries of extremities, unspecified extremity: Secondary | ICD-10-CM | POA: Diagnosis not present

## 2012-12-27 DIAGNOSIS — M1710 Unilateral primary osteoarthritis, unspecified knee: Secondary | ICD-10-CM | POA: Diagnosis not present

## 2013-01-18 DIAGNOSIS — I70209 Unspecified atherosclerosis of native arteries of extremities, unspecified extremity: Secondary | ICD-10-CM | POA: Diagnosis not present

## 2013-01-31 DIAGNOSIS — H1130 Conjunctival hemorrhage, unspecified eye: Secondary | ICD-10-CM | POA: Diagnosis not present

## 2013-02-01 DIAGNOSIS — H905 Unspecified sensorineural hearing loss: Secondary | ICD-10-CM | POA: Diagnosis not present

## 2013-02-01 DIAGNOSIS — H938X9 Other specified disorders of ear, unspecified ear: Secondary | ICD-10-CM | POA: Diagnosis not present

## 2013-03-02 DIAGNOSIS — M6010 Interstitial myositis of unspecified site: Secondary | ICD-10-CM | POA: Diagnosis not present

## 2013-03-02 DIAGNOSIS — M25469 Effusion, unspecified knee: Secondary | ICD-10-CM | POA: Diagnosis not present

## 2013-03-02 DIAGNOSIS — M25549 Pain in joints of unspecified hand: Secondary | ICD-10-CM | POA: Diagnosis not present

## 2013-03-02 DIAGNOSIS — M1710 Unilateral primary osteoarthritis, unspecified knee: Secondary | ICD-10-CM | POA: Diagnosis not present

## 2013-03-02 DIAGNOSIS — Z79899 Other long term (current) drug therapy: Secondary | ICD-10-CM | POA: Diagnosis not present

## 2013-03-02 DIAGNOSIS — M25579 Pain in unspecified ankle and joints of unspecified foot: Secondary | ICD-10-CM | POA: Diagnosis not present

## 2013-03-02 DIAGNOSIS — R300 Dysuria: Secondary | ICD-10-CM | POA: Diagnosis not present

## 2013-03-02 DIAGNOSIS — M2550 Pain in unspecified joint: Secondary | ICD-10-CM | POA: Diagnosis not present

## 2013-03-02 DIAGNOSIS — M25569 Pain in unspecified knee: Secondary | ICD-10-CM | POA: Diagnosis not present

## 2013-03-02 DIAGNOSIS — R5381 Other malaise: Secondary | ICD-10-CM | POA: Diagnosis not present

## 2013-03-18 DIAGNOSIS — M1710 Unilateral primary osteoarthritis, unspecified knee: Secondary | ICD-10-CM | POA: Diagnosis not present

## 2013-03-25 DIAGNOSIS — M25549 Pain in joints of unspecified hand: Secondary | ICD-10-CM | POA: Diagnosis not present

## 2013-03-25 DIAGNOSIS — R6889 Other general symptoms and signs: Secondary | ICD-10-CM | POA: Diagnosis not present

## 2013-03-25 DIAGNOSIS — M25569 Pain in unspecified knee: Secondary | ICD-10-CM | POA: Diagnosis not present

## 2013-03-29 DIAGNOSIS — I70209 Unspecified atherosclerosis of native arteries of extremities, unspecified extremity: Secondary | ICD-10-CM | POA: Diagnosis not present

## 2013-04-01 ENCOUNTER — Other Ambulatory Visit (HOSPITAL_COMMUNITY): Payer: Self-pay | Admitting: Rheumatology

## 2013-04-01 DIAGNOSIS — R748 Abnormal levels of other serum enzymes: Secondary | ICD-10-CM

## 2013-04-04 ENCOUNTER — Encounter (HOSPITAL_COMMUNITY)
Admission: RE | Admit: 2013-04-04 | Discharge: 2013-04-04 | Disposition: A | Discharge status: Home / Self Care | Payer: Medicare Other | Source: Ambulatory Visit | Attending: Rheumatology | Admitting: Rheumatology

## 2013-04-04 ENCOUNTER — Encounter (HOSPITAL_COMMUNITY): Payer: Self-pay

## 2013-04-04 DIAGNOSIS — R937 Abnormal findings on diagnostic imaging of other parts of musculoskeletal system: Secondary | ICD-10-CM | POA: Insufficient documentation

## 2013-04-04 DIAGNOSIS — R748 Abnormal levels of other serum enzymes: Secondary | ICD-10-CM

## 2013-04-04 DIAGNOSIS — M25569 Pain in unspecified knee: Secondary | ICD-10-CM | POA: Diagnosis not present

## 2013-04-04 MED ORDER — TECHNETIUM TC 99M MEDRONATE IV KIT
25.0000 | PACK | Freq: Once | INTRAVENOUS | Status: AC | PRN
  Administered 2013-04-04: 25 via INTRAVENOUS

## 2013-04-06 ENCOUNTER — Telehealth: Payer: Self-pay | Admitting: Gastroenterology

## 2013-04-06 NOTE — Telephone Encounter (Signed)
Pt is aware of OV on 7/23 at 10 with AS, but needs HFP prior to OV

## 2013-04-06 NOTE — Telephone Encounter (Signed)
[name], looks like this pt is on your schedule. Please advise! ( Dr. Darrick Penna had just said to get labs from Dr. Felecia Shelling and I checked recently and no recent labs with him).

## 2013-04-06 NOTE — Telephone Encounter (Signed)
We can just wait until her OV to decide what's necessary.

## 2013-04-07 NOTE — Telephone Encounter (Signed)
Thanks

## 2013-04-12 DIAGNOSIS — I10 Essential (primary) hypertension: Secondary | ICD-10-CM | POA: Diagnosis not present

## 2013-04-12 DIAGNOSIS — M1990 Unspecified osteoarthritis, unspecified site: Secondary | ICD-10-CM | POA: Diagnosis not present

## 2013-04-14 ENCOUNTER — Ambulatory Visit (HOSPITAL_COMMUNITY): Payer: Medicare Other

## 2013-04-21 ENCOUNTER — Encounter (HOSPITAL_COMMUNITY): Payer: Medicare Other | Attending: Hematology and Oncology

## 2013-04-21 ENCOUNTER — Encounter (HOSPITAL_COMMUNITY): Payer: Self-pay

## 2013-04-21 VITALS — BP 123/80 | HR 72 | Temp 98.4°F | Resp 18 | Ht 64.75 in | Wt 134.4 lb

## 2013-04-21 DIAGNOSIS — Z09 Encounter for follow-up examination after completed treatment for conditions other than malignant neoplasm: Secondary | ICD-10-CM | POA: Diagnosis not present

## 2013-04-21 DIAGNOSIS — D472 Monoclonal gammopathy: Secondary | ICD-10-CM | POA: Diagnosis not present

## 2013-04-21 LAB — CBC WITH DIFFERENTIAL/PLATELET
Basophils Absolute: 0 10*3/uL (ref 0.0–0.1)
Basophils Relative: 1 % (ref 0–1)
Eosinophils Relative: 1 % (ref 0–5)
HCT: 38.9 % (ref 36.0–46.0)
MCHC: 33.7 g/dL (ref 30.0–36.0)
MCV: 95.3 fL (ref 78.0–100.0)
Monocytes Absolute: 0.5 10*3/uL (ref 0.1–1.0)
Monocytes Relative: 10 % (ref 3–12)
RDW: 12.7 % (ref 11.5–15.5)

## 2013-04-21 LAB — COMPREHENSIVE METABOLIC PANEL
AST: 23 U/L (ref 0–37)
Albumin: 4.1 g/dL (ref 3.5–5.2)
BUN: 48 mg/dL — ABNORMAL HIGH (ref 6–23)
CO2: 33 mEq/L — ABNORMAL HIGH (ref 19–32)
Calcium: 10.1 mg/dL (ref 8.4–10.5)
Creatinine, Ser: 1.42 mg/dL — ABNORMAL HIGH (ref 0.50–1.10)
GFR calc non Af Amer: 32 mL/min — ABNORMAL LOW (ref >90)

## 2013-04-21 NOTE — Progress Notes (Signed)
Patient History and Physical   [name] [id number] Jan 09, 1922 79 y.o. 04/21/2013  Referring NW:[id number], Brianna Rouse, MD   Chief Complaint: Abnormal SPEP  HPI:  [name] is an 79 year old woman was accompanied to this visit by her niece [name], who provided most of the history as patient reportedly has hearing impairment. 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  No further test result is available at this time in this regard.  She is known to have an rheumatoid factor positive RA  and concern about monoclonal gammopathy was raised by [name].  Patient tells me that she feels well and has no new complaints at this time.  She denies back pain, her weight has been stable at least over the last year. She ambulates with a walker. States that her appetite is good.  PMH: Past Medical History  Diagnosis Date  . Memory loss   . Leukopenia   . Hyponatremia   . Other and unspecified hyperlipidemia   . Proteinuria   . Edema   . Unspecified asthma, with exacerbation   . Arthropathy, unspecified, site unspecified   . Unspecified essential hypertension   . S/P colonoscopy Jan 2012    anal papilla, internal hemorrhoids, melanosis coli  . Hyperlipidemia     Past Surgical History  Procedure Laterality Date  . None    . Esophagogastroduodenoscopy  06/16/2011    modreate gastritis  . Colonoscopy  11/05/2010    Anal papilla and internal hemorrhoids pigmented rectal mucosa consistent with melanosis coli/ Diffuse pigmentation of the colonic mucosa consistent with melanosis coli, localized cecal diverticula as described above    Allergies: No Known Allergies  Medications: Current outpatient prescriptions:acetaminophen (TYLENOL) 325 MG tablet, Take 650 mg by mouth every 6 (six) hours as needed.  , Disp: , Rfl: ;  amLODipine (NORVASC) 5 MG  tablet, Take 1 tablet (5 mg total) by mouth daily., Disp: 30 tablet, Rfl: 3;  Colesevelam HCl (WELCHOL PO), Take by mouth daily. , Disp: , Rfl:  furosemide (LASIX) 40 MG tablet, Take 1 tablet (40 mg total) by mouth 2 (two) times daily. Take one tablet by mouth every 4 days as needed for fluid, Disp: 60 tablet, Rfl: 1;  HYDROCODONE-ACETAMINOPHEN PO, Take 1 tablet by mouth 2 (two) times daily as needed. , Disp: , Rfl: ;  IRON PO, Take 325 mg by mouth 2 (two) times daily. , Disp: , Rfl: ;  omeprazole (PRILOSEC) 20 MG capsule, take 1 tablet every morning, Disp: 30 capsule, Rfl: 11 ramipril (ALTACE) 10 MG capsule, Take 10 mg by mouth daily., Disp: , Rfl:    Social History: She reports that she has quit smoking. She does not have any smokeless tobacco history on file. She reports that she does not drink alcohol or use illicit drugs. Used to smoke several years ago "socially" Denies alcohol intake. Lives with Niece [name].Was a Marine scientist.   Family History: Family History  Problem Relation Age of Onset  . Colon cancer Neg Hx   . Heart disease    . Arthritis      Review of Systems: 14 point review of system is as in the history above otherwise negative.   Physical Exam: Blood pressure 123/80, pulse 72, temperature 98.4 F (36.9 C), temperature source Oral, resp. rate 18, height 5' 4.75" (1.645 m), weight 134 lb 6.4 oz (  60.963 kg). GENERAL: No distress , good for stated age.  SKIN:  No rashes or significant lesions  HEAD: Normocephalic, No masses, lesions, tenderness or abnormalities  EYES: Conjunctiva are pink and non-injected  ENT: External ears normal ,lips, buccal mucosa, and tongue normal and mucous membranes are moist  LYMPH: No palpable lymphadenopathy, neck, supraclavicular, axillary or inguinal lymph node. LUNGS: clear to auscultation , no crackles or wheezes HEART: regular rate & rhythm, no murmurs, no gallops, S1 normal and S2 normal  ABDOMEN: Abdomen soft, non-tender, normal  bowel sounds, no masses or organomegaly and no hepatosplenomegaly palpable. EXTREMITIES: No edema, no skin discoloration or tenderness NEURO: alert & oriented , no focal motor/sensory deficits.     Lab Results: Lab Results  Component Value Date   WBC 4.4 06/20/2011   HGB 12.3 06/20/2011   HCT 36.5 06/20/2011   MCV 98.1 06/20/2011   PLT 246 06/20/2011     Chemistry      Component Value Date/Time   NA 138 06/20/2011 1200   K 4.3 06/20/2011 1200   CL 106 06/20/2011 1200   CO2 19 06/20/2011 1200   BUN 24* 06/20/2011 1200   CREATININE 0.92 06/20/2011 1200   CREATININE 0.75 04/15/2011 0605      Component Value Date/Time   CALCIUM 9.8 06/20/2011 1200   ALKPHOS 143* 06/20/2011 1200   AST 19 06/20/2011 1200   ALT 10 06/20/2011 1200   BILITOT 0.5 06/20/2011 1200         Radiological Studies: Nm Bone Scan Whole Body  04/04/2013   *RADIOLOGY REPORT*  Clinical Data: Bilateral knee pain for 1 year.  Elevated alkaline phosphatase levels.  NUCLEAR MEDICINE WHOLE BODY BONE SCINTIGRAPHY  Technique:  Whole body anterior and posterior images were obtained approximately 3 hours after intravenous injection of radiopharmaceutical.  Radiopharmaceutical: CURIE TC-MDP TECHNETIUM TC 54M MEDRONATE IV KIT  Comparison: Abdominal pelvic CT 04/17/2011.  Right knee radiographs 01/27/2008  Findings: There is prominent bilateral knee uptake, appearing to primarily involve the tibial plateaus.  There is mild arthropathic activity in both wrists.  No other focal osseous activity is demonstrated.  There is a mild thoracolumbar scoliosis.  Left pelvic activity is likely within the bladder which was asymmetrically positioned on the prior CT.  Although in close proximity to the left superior pubic ramus, this is not felt to be osseous, and no osseous abnormality was apparent in this area on prior CT.  IMPRESSION:  1.  Nonspecific increased activity in both knees. This could reflect arthropathy or insufficiency fractures.   Given the patient's knee pain, correlation with the radiographs advised. 2.  No other suspicious osseous activity.  There is mild arthropathic activity at both wrists. 3.  Mild scoliosis.   Original Report Authenticated By: Carey Bullocks, M.D.      Impression: I suspect that patient may have MGUS given the degree of monoclonal peak.  However the specific type of protein is not known due to lack of immunofixation result. I would take the liberty of doing a full myeloma panel with light chain assay. I will also obtain CBC and CMP keeping in mind CRAB features. My suspicion for multiple myeloma is however low.   Recommendations: 1.  Myeloma panel with light chain assay ordered today. 2.CBC and CMP ordered. 3.  She'll return to clinic in 2 weeks to review the results.     All questions were satisfactorily answered.She knows to call if she has any concern.  I spent more than 50%  on counseling the patient face to face. The total time spent in the appointment was 45 minutes.  Thank you Dr Corliss [name] for this referral.    Sherral Hammers, MD FACP. Hematology/Oncology.     Marland Kitchen

## 2013-04-21 NOTE — Patient Instructions (Addendum)
[facility] Discharge Instructions  RECOMMENDATIONS MADE BY THE CONSULTANT AND ANY TEST RESULTS WILL BE SENT TO YOUR REFERRING PHYSICIAN.  EXAM FINDINGS BY THE PHYSICIAN TODAY AND SIGNS OR SYMPTOMS TO REPORT TO CLINIC OR PRIMARY PHYSICIAN: Exam and discussion by Dr. Sharia Reeve.  We need to do some additional blood work to determine what might be going on.  We don't have enough information at this point to make a determination.  MEDICATIONS PRESCRIBED:  None   SPECIAL INSTRUCTIONS/FOLLOW-UP: We will see you back in 2 weeks for follow-up.  Thank you for choosing [facility] to provide your oncology and hematology care.  To afford each patient quality time with our providers, please arrive at least 15 minutes before your scheduled appointment time.  With your help, our goal is to use those 15 minutes to complete the necessary work-up to ensure our physicians have the information they need to help with your evaluation and healthcare recommendations.    Effective January 1st, 2014, we ask that you re-schedule your appointment with our physicians should you arrive 10 or more minutes late for your appointment.  We strive to give you quality time with our providers, and arriving late affects you and other patients whose appointments are after yours.    Again, thank you for choosing [facility].  Our hope is that these requests will decrease the amount of time that you wait before being seen by our physicians.       _____________________________________________________________  Should you have questions after your visit to [facility], please contact our office at [phone] between the hours of 8:30 a.m. and 5:00 p.m.  Voicemails left after 4:30 p.m. will not be returned until the following business day.  For prescription refill requests, have your pharmacy contact our office with your prescription refill request.

## 2013-04-21 NOTE — Progress Notes (Signed)
[name] presented for labwork. Labs per MD order drawn via Peripheral Line 23 gauge needle inserted in left AC  Good blood return present. Procedure without incident.  Needle removed intact. Patient tolerated procedure well.

## 2013-04-21 NOTE — Progress Notes (Deleted)
Patient History and Physical   [name] [id number] [birth year] 79 y.o. 04/21/2013  Referring MD:***  Chief Complaint: ***   HPI:  ***   PMH: Past Medical History  Diagnosis Date  . Memory loss   . Leukopenia   . Hyponatremia   . Other and unspecified hyperlipidemia   . Proteinuria   . Edema   . Unspecified asthma, with exacerbation   . Arthropathy, unspecified, site unspecified   . Unspecified essential hypertension   . S/P colonoscopy Jan 2012    anal papilla, internal hemorrhoids, melanosis coli  . Hyperlipidemia     Past Surgical History  Procedure Laterality Date  . None    . Esophagogastroduodenoscopy  06/16/2011    modreate gastritis  . Colonoscopy  11/05/2010    Anal papilla and internal hemorrhoids pigmented rectal mucosa consistent with melanosis coli/ Diffuse pigmentation of the colonic mucosa consistent with melanosis coli, localized cecal diverticula as described above    Allergies: No Known Allergies  Medications: Current outpatient prescriptions:acetaminophen (TYLENOL) 325 MG tablet, Take 650 mg by mouth every 6 (six) hours as needed.  , Disp: , Rfl: ;  amLODipine (NORVASC) 5 MG tablet, Take 1 tablet (5 mg total) by mouth daily., Disp: 30 tablet, Rfl: 3;  Colesevelam HCl (WELCHOL PO), Take by mouth daily. , Disp: , Rfl:  furosemide (LASIX) 40 MG tablet, Take 1 tablet (40 mg total) by mouth 2 (two) times daily. Take one tablet by mouth every 4 days as needed for fluid, Disp: 60 tablet, Rfl: 1;  HYDROCODONE-ACETAMINOPHEN PO, Take 1 tablet by mouth 2 (two) times daily as needed. , Disp: , Rfl: ;  IRON PO, Take 325 mg by mouth 2 (two) times daily. , Disp: , Rfl: ;  omeprazole (PRILOSEC) 20 MG capsule, take 1 tablet every morning, Disp: 30 capsule, Rfl: 11 ramipril (ALTACE) 10 MG capsule, Take 10 mg by mouth daily., Disp: , Rfl:    Social History:   reports that she has quit smoking. She does not have any smokeless tobacco history on file. She reports  that she does not drink alcohol or use illicit drugs. Used to smoke several years ago "socially" Denies alcohol intake. Lives with Niece Gabriel Carina.Was a Marine scientist.  Family History: Family History  Problem Relation Age of Onset  . Colon cancer Neg Hx   . Heart disease    . Arthritis      Review of Systems: ***   Physical Exam: Blood pressure 123/80, pulse 72, temperature 98.4 F (36.9 C), temperature source Oral, resp. rate 18, height 5' 4.75" (1.645 m), weight 134 lb 6.4 oz (60.963 kg). GENERAL: No distress, well nourished.  SKIN:  No rashes or significant lesions  HEAD: Normocephalic, No masses, lesions, tenderness or abnormalities  EYES: Conjunctiva are pink and non-injected  ENT: External ears normal ,lips, buccal mucosa, and tongue normal and mucous membranes are moist  LYMPH: No palpable lymphadenopathy,  BREAST:Normal without mass, skin or nipple changes or axillary nodes,  LUNGS: clear to auscultation , no crackles or wheezes HEART: regular rate & rhythm, no murmurs, no gallops, S1 normal and S2 normal  ABDOMEN: Abdomen soft, non-tender, normal bowel sounds, no masses or organomegaly and no hepatosplenomegaly  MSK: No CVA tenderness and no tenderness on percussion of the back or rib cage. EXTREMITIES: No edema, no skin discoloration or tenderness NEURO: alert & oriented , no focal motor/sensory deficits.     Lab Results: Lab Results  Component Value Date   WBC 4.4  06/20/2011   HGB 12.3 06/20/2011   HCT 36.5 06/20/2011   MCV 98.1 06/20/2011   PLT 246 06/20/2011     Chemistry      Component Value Date/Time   NA 138 06/20/2011 1200   K 4.3 06/20/2011 1200   CL 106 06/20/2011 1200   CO2 19 06/20/2011 1200   BUN 24* 06/20/2011 1200   CREATININE 0.92 06/20/2011 1200   CREATININE 0.75 04/15/2011 0605      Component Value Date/Time   CALCIUM 9.8 06/20/2011 1200   ALKPHOS 143* 06/20/2011 1200   AST 19 06/20/2011 1200   ALT 10 06/20/2011 1200   BILITOT 0.5 06/20/2011 1200          Radiological Studies: Nm Bone Scan Whole Body  04/04/2013   *RADIOLOGY REPORT*  Clinical Data: Bilateral knee pain for 1 year.  Elevated alkaline phosphatase levels.  NUCLEAR MEDICINE WHOLE BODY BONE SCINTIGRAPHY  Technique:  Whole body anterior and posterior images were obtained approximately 3 hours after intravenous injection of radiopharmaceutical.  Radiopharmaceutical: CURIE TC-MDP TECHNETIUM TC 32M MEDRONATE IV KIT  Comparison: Abdominal pelvic CT 04/17/2011.  Right knee radiographs 01/27/2008  Findings: There is prominent bilateral knee uptake, appearing to primarily involve the tibial plateaus.  There is mild arthropathic activity in both wrists.  No other focal osseous activity is demonstrated.  There is a mild thoracolumbar scoliosis.  Left pelvic activity is likely within the bladder which was asymmetrically positioned on the prior CT.  Although in close proximity to the left superior pubic ramus, this is not felt to be osseous, and no osseous abnormality was apparent in this area on prior CT.  IMPRESSION:  1.  Nonspecific increased activity in both knees. This could reflect arthropathy or insufficiency fractures.  Given the patient's knee pain, correlation with the radiographs advised. 2.  No other suspicious osseous activity.  There is mild arthropathic activity at both wrists. 3.  Mild scoliosis.   Original Report Authenticated By: Carey Bullocks, M.D.      Impression: ***  Recommendations: ***     All questions were satisfactorily answered.She knows to call if she has any concern.  I spent {CHL ONC TIME VISIT - ZOXWR:[id number]} counseling the patient face to face. The total time spent in the appointment was {CHL ONC TIME VISIT - BJYNW:[id number]}.   Sherral Hammers, MD FACP. Hematology/Oncology.     Marland Kitchen

## 2013-04-22 LAB — KAPPA/LAMBDA LIGHT CHAINS: Kappa, lambda light chain ratio: 6.46 — ABNORMAL HIGH (ref 0.26–1.65)

## 2013-04-25 LAB — MULTIPLE MYELOMA PANEL, SERUM
Albumin ELP: 55.8 % (ref 55.8–66.1)
Alpha-1-Globulin: 4.6 % (ref 2.9–4.9)
Beta 2: 4.8 % (ref 3.2–6.5)
IgA: 200 mg/dL (ref 69–380)
IgM, Serum: 531 mg/dL — ABNORMAL HIGH (ref 52–322)
Total Protein: 7.6 g/dL (ref 6.0–8.3)

## 2013-04-27 ENCOUNTER — Encounter: Payer: Self-pay | Admitting: Gastroenterology

## 2013-04-27 ENCOUNTER — Ambulatory Visit (INDEPENDENT_AMBULATORY_CARE_PROVIDER_SITE_OTHER): Payer: Medicare Other | Admitting: Gastroenterology

## 2013-04-27 VITALS — BP 170/85 | HR 78 | Temp 98.0°F | Ht 65.0 in | Wt 138.2 lb

## 2013-04-27 DIAGNOSIS — R748 Abnormal levels of other serum enzymes: Secondary | ICD-10-CM

## 2013-04-27 DIAGNOSIS — D649 Anemia, unspecified: Secondary | ICD-10-CM

## 2013-04-27 NOTE — Assessment & Plan Note (Addendum)
Alkaline phosphatase 250. Workup for multiple myeloma in progress. Followup pending labs.

## 2013-04-27 NOTE — Patient Instructions (Addendum)
1. I will followup on your pending blood work and let you know if you need any further test from our office. 2. Office visit one year with Dr. Darrick Penna

## 2013-04-27 NOTE — Assessment & Plan Note (Signed)
Current hemoglobin is normal. Consider stopping iron therapy

## 2013-04-27 NOTE — Progress Notes (Signed)
Primary Care Physician: Avon Gully, MD  Primary Gastroenterologist:  Jonette Eva, MD   Chief Complaint  Patient presents with  . Follow-up    HPI: [name] is a 79 y.o. female here for scheduled f/u visit. She is a history of elevated alkaline phosphatase and anemia. Workup of abnormal alkaline phosphatase in the past includes liver biopsy x2. Findings on first one was portal inflammation, second was nonspecific portal changes, copper stains negative. MRCP in the past as well with finding of ductal dilation but no stones. CT in July 2012 show slightly lobulated and increased size of liver versus previous study in 2006, cirrhosis not entirely excluded but no nodularity identified. In 2012 her AMA, anti-smooth muscle antibody, ANA, hepatitis C antibody were all negative. Recently saw oncologist, Dr. Sharia Reeve who has scheduled her for Multiple Myeloma work-up. Her last alkaline phosphatase done on 04/21/2013 was 251 with other LFTs normal. CBC actually normal.  Only pain is in both knees. No abdominal pain, constipation, no brbpr or melena. No heartburn, appetite is good. Still on iron supplements. No other complaints.  Past Surgical History  Procedure Laterality Date  . None    . Esophagogastroduodenoscopy  06/16/2011    Dr. Darrick Penna: moderate gastritis, no h.pylori  . Colonoscopy  11/05/2010    Dr. Jena Gauss: Anal papilla and internal hemorrhoids pigmented rectal mucosa consistent with melanosis coli/ Diffuse pigmentation of the colonic mucosa consistent with melanosis coli, localized cecal diverticula as described above     Current Outpatient Prescriptions  Medication Sig Dispense Refill  . acetaminophen (TYLENOL) 325 MG tablet Take 650 mg by mouth every 6 (six) hours as needed.        Marland Kitchen amLODipine (NORVASC) 5 MG tablet Take 5 mg by mouth daily.      . Colesevelam HCl (WELCHOL PO) Take by mouth daily.       . furosemide (LASIX) 40 MG tablet Take 1 tablet (40 mg total) by mouth 2  (two) times daily. Take one tablet by mouth every 4 days as needed for fluid  60 tablet  1  . HYDROCODONE-ACETAMINOPHEN PO Take 1 tablet by mouth 2 (two) times daily as needed.       . IRON PO Take 325 mg by mouth 2 (two) times daily.       Marland Kitchen omeprazole (PRILOSEC) 20 MG capsule take 1 tablet every morning  30 capsule  11  . polyethylene glycol (MIRALAX / GLYCOLAX) packet Take 17 g by mouth daily as needed.      . ramipril (ALTACE) 10 MG capsule Take 10 mg by mouth daily.       No current facility-administered medications for this visit.    Allergies as of 04/27/2013  . (No Known Allergies)    ROS:  General: Negative for anorexia, weight loss, fever, chills, fatigue, weakness. ENT: Negative for hoarseness, difficulty swallowing , nasal congestion. CV: Negative for chest pain, angina, palpitations, dyspnea on exertion, peripheral edema.  Respiratory: Negative for dyspnea at rest, dyspnea on exertion, cough, sputum, wheezing.  GI: See history of present illness. GU:  Negative for dysuria, hematuria, urinary incontinence, urinary frequency, nocturnal urination.  Endo: Negative for unusual weight change.    Physical Examination:   BP 170/85  Pulse 78  Temp(Src) 98 F (36.7 C) (Oral)  Ht 5\' 5"  (1.651 m)  Wt 138 lb 3.2 oz (62.687 kg)  BMI 23 kg/m2  General: Well-nourished, well-developed in no acute distress. Accompanied by niece. Eyes: No icterus. Mouth: Oropharyngeal mucosa moist and  pink , no lesions erythema or exudate. Lungs: Clear to auscultation bilaterally.  Heart: Regular rate and rhythm, no murmurs rubs or gallops.  Abdomen: Bowel sounds are normal, nontender, nondistended, no hepatosplenomegaly or masses, no abdominal bruits or hernia , no rebound or guarding.   Extremities: No lower extremity edema. No clubbing or deformities. Neuro: Alert and oriented x 4   Skin: Warm and dry, no jaundice.   Psych: Alert and cooperative, normal mood and affect.  Labs:  Lab Results   Component Value Date   CREATININE 1.42* 04/21/2013   BUN 48* 04/21/2013   NA 136 04/21/2013   K 3.3* 04/21/2013   CL 93* 04/21/2013   CO2 33* 04/21/2013   Lab Results  Component Value Date   WBC 5.3 04/21/2013   HGB 13.1 04/21/2013   HCT 38.9 04/21/2013   MCV 95.3 04/21/2013   PLT 205 04/21/2013   Lab Results  Component Value Date   ALT 14 04/21/2013   AST 23 04/21/2013   ALKPHOS 251* 04/21/2013   BILITOT 0.3 04/21/2013    Imaging Studies: Nm Bone Scan Whole Body  04/04/2013   *RADIOLOGY REPORT*  Clinical Data: Bilateral knee pain for 1 year.  Elevated alkaline phosphatase levels.  NUCLEAR MEDICINE WHOLE BODY BONE SCINTIGRAPHY  Technique:  Whole body anterior and posterior images were obtained approximately 3 hours after intravenous injection of radiopharmaceutical.  Radiopharmaceutical: CURIE TC-MDP TECHNETIUM TC 2M MEDRONATE IV KIT  Comparison: Abdominal pelvic CT 04/17/2011.  Right knee radiographs 01/27/2008  Findings: There is prominent bilateral knee uptake, appearing to primarily involve the tibial plateaus.  There is mild arthropathic activity in both wrists.  No other focal osseous activity is demonstrated.  There is a mild thoracolumbar scoliosis.  Left pelvic activity is likely within the bladder which was asymmetrically positioned on the prior CT.  Although in close proximity to the left superior pubic ramus, this is not felt to be osseous, and no osseous abnormality was apparent in this area on prior CT.  IMPRESSION:  1.  Nonspecific increased activity in both knees. This could reflect arthropathy or insufficiency fractures.  Given the patient's knee pain, correlation with the radiographs advised. 2.  No other suspicious osseous activity.  There is mild arthropathic activity at both wrists. 3.  Mild scoliosis.   Original Report Authenticated By: Carey Bullocks, M.D.

## 2013-04-27 NOTE — Progress Notes (Signed)
Cc PCP 

## 2013-05-05 ENCOUNTER — Encounter (HOSPITAL_COMMUNITY): Payer: Self-pay

## 2013-05-05 ENCOUNTER — Encounter (HOSPITAL_BASED_OUTPATIENT_CLINIC_OR_DEPARTMENT_OTHER): Payer: Medicare Other

## 2013-05-05 VITALS — BP 142/80 | HR 74 | Temp 98.1°F | Resp 18 | Wt 138.8 lb

## 2013-05-05 DIAGNOSIS — D472 Monoclonal gammopathy: Secondary | ICD-10-CM

## 2013-05-05 NOTE — Patient Instructions (Addendum)
[facility] Discharge Instructions  RECOMMENDATIONS MADE BY THE CONSULTANT AND ANY TEST RESULTS WILL BE SENT TO YOUR REFERRING PHYSICIAN.  EXAM FINDINGS BY THE PHYSICIAN TODAY AND SIGNS OR SYMPTOMS TO REPORT TO CLINIC OR PRIMARY PHYSICIAN: Discussion of laboratory findings by Dr. Sharia Reeve.   MEDICATIONS PRESCRIBED:  none  INSTRUCTIONS GIVEN AND DISCUSSED: Report fevers, night sweats, increased fatigue, etc.  SPECIAL INSTRUCTIONS/FOLLOW-UP: Blood work in 6 months and follow-up 1 week later.  Thank you for choosing [facility] to provide your oncology and hematology care.  To afford each patient quality time with our providers, please arrive at least 15 minutes before your scheduled appointment time.  With your help, our goal is to use those 15 minutes to complete the necessary work-up to ensure our physicians have the information they need to help with your evaluation and healthcare recommendations.    Effective January 1st, 2014, we ask that you re-schedule your appointment with our physicians should you arrive 10 or more minutes late for your appointment.  We strive to give you quality time with our providers, and arriving late affects you and other patients whose appointments are after yours.    Again, thank you for choosing [facility].  Our hope is that these requests will decrease the amount of time that you wait before being seen by our physicians.       _____________________________________________________________  Should you have questions after your visit to [facility], please contact our office at [phone] between the hours of 8:30 a.m. and 5:00 p.m.  Voicemails left after 4:30 p.m. will not be returned until the following business day.  For prescription refill requests, have your pharmacy contact our office with your prescription refill request.

## 2013-05-05 NOTE — Progress Notes (Signed)
[facility] [phone]   [phone]  OFFICE PROGRESS NOTE  Avon Gully, MD [address] [place] [address]  DIAGNOSIS: IgM MGUS   INTERVAL HISTORY:   [name] 79 y.o. female returns to the clinic today for scheduled follow up.  I had seen her previously on 04/21/2013 for abnormal serum protein electrophoresis. At that time patient did not have any lymphadenopathy or hepatosplenomegaly.  I had ordered serum protein electrophoreses with immunofixation, light chain assay and quantitative immunoglobulins.  Serum protein electrophoresis showed M spike of 0.46 with IgM of 531 mg /dL, kappa was  95.6 ,lambda of  2.9 and k/l ratio of 6.46.  IgG and IgA were normal. CBC was essentially unremarkable, creatinine was slightly raised at 1.4.  She had today to review these results.  She denies any new problems since her last time I saw her .  Arthritis symptoms are as in status quo.  MEDICAL HISTORY: Past Medical History  Diagnosis Date  . Memory loss   . Leukopenia   . Hyponatremia   . Other and unspecified hyperlipidemia   . Proteinuria   . Edema   . Unspecified asthma, with exacerbation   . Arthropathy, unspecified, site unspecified   . Unspecified essential hypertension   . S/P colonoscopy Jan 2012    anal papilla, internal hemorrhoids, melanosis coli  . Hyperlipidemia     ALLERGIES:  has No Known Allergies.  MEDICATIONS:  Current Outpatient Prescriptions  Medication Sig Dispense Refill  . acetaminophen (TYLENOL) 325 MG tablet Take 650 mg by mouth every 6 (six) hours as needed.        Marland Kitchen amLODipine (NORVASC) 5 MG tablet Take 5 mg by mouth daily.      . furosemide (LASIX) 40 MG tablet Take 40 mg by mouth as needed. Take one tablet by mouth every 4 days as needed for fluid      . HYDROCODONE-ACETAMINOPHEN PO Take 1 tablet by mouth 2 (two) times daily as needed.       . IRON PO Take 325 mg by mouth 2 (two) times daily.        Marland Kitchen omeprazole (PRILOSEC) 20 MG capsule take 1 tablet every morning  30 capsule  11  . polyethylene glycol (MIRALAX / GLYCOLAX) packet Take 17 g by mouth daily as needed.      . ramipril (ALTACE) 10 MG capsule Take 10 mg by mouth daily.      . Colesevelam HCl (WELCHOL PO) Take by mouth daily.        No current facility-administered medications for this visit.    SURGICAL HISTORY:  Past Surgical History  Procedure Laterality Date  . None    . Esophagogastroduodenoscopy  06/16/2011    Dr. Darrick Penna: moderate gastritis, no h.pylori  . Colonoscopy  11/05/2010    Dr. Jena Gauss: Anal papilla and internal hemorrhoids pigmented rectal mucosa consistent with melanosis coli/ Diffuse pigmentation of the colonic mucosa consistent with melanosis coli, localized cecal diverticula as described above        PHYSICAL EXAMINATION:  Blood pressure 142/80, pulse 74, temperature 98.1 F (36.7 C), temperature source Oral, resp. rate 18, weight 138 lb 12.8 oz (62.959 kg). GENERAL: No acute distress. NEURO: Alert & oriented   Full physical exam deferred.    LABORATORY DATA: Lab Results  Component Value Date   WBC 5.3 04/21/2013   HGB 13.1 04/21/2013   HCT 38.9 04/21/2013   MCV 95.3 04/21/2013  PLT 205 04/21/2013      Chemistry      Component Value Date/Time   NA 136 04/21/2013 1620   K 3.3* 04/21/2013 1620   CL 93* 04/21/2013 1620   CO2 33* 04/21/2013 1620   BUN 48* 04/21/2013 1620   CREATININE 1.42* 04/21/2013 1620   CREATININE 0.92 06/20/2011 1200      Component Value Date/Time   CALCIUM 10.1 04/21/2013 1620   ALKPHOS 251* 04/21/2013 1620   AST 23 04/21/2013 1620   ALT 14 04/21/2013 1620   BILITOT 0.3 04/21/2013 1620       RADIOGRAPHIC STUDIES: No results found.   ASSESSMENT:  Ms. Whisner most likely has IgM MGUS, she lacks lymphadenopathy to suggest lymphoplasmacytic lymphoma and does not have any symptom of waldestroms macroglobulinemia at this time.  Monoclonal IgM can also be seen in  amyloidosis,but the patient does not have any clinical feature suggestive of this at this time. Besides patient has a normal albumin which makes ,amyloidosis very unlikely. I feel that given her age and comorbidities watchful waiting is a reasonable step. I  have also reviewed algorithm published in [employer] article on work up of IgM monoclonal protein and no further workup is indicated at this time. We have discussed her diagnosis in detail and I provided the patient with educational material about her diagnosis.  I have explained her that there is a risk of progression to myeloma even though this risk is relatively small in her case.  Additionally this could later manifest a lymphoplasmacytic lymphoma/Waldenstrm's macroglobulinemia or amyloidosis  in the near future but at this point in time there is no evidence of these.  PLAN:  1. I reviewed the lab results with patient and her Relative. 2. 6 monthly clinic follow up visits with myeloma panel. 3. Routine medical care about other providers.     All questions were satisfactorily answered. Patient knows to call if  any concern arises.  I spent more than 50 % counseling the patient face to face. The total time spent in the appointment was 30 minutes.   Sherral Hammers, MD FACP. Hematology/Oncology.

## 2013-05-11 ENCOUNTER — Encounter: Payer: Self-pay | Admitting: Hematology and Oncology

## 2013-06-10 DIAGNOSIS — M1710 Unilateral primary osteoarthritis, unspecified knee: Secondary | ICD-10-CM | POA: Diagnosis not present

## 2013-06-14 DIAGNOSIS — I70209 Unspecified atherosclerosis of native arteries of extremities, unspecified extremity: Secondary | ICD-10-CM | POA: Diagnosis not present

## 2013-06-14 DIAGNOSIS — L851 Acquired keratosis [keratoderma] palmaris et plantaris: Secondary | ICD-10-CM | POA: Diagnosis not present

## 2013-06-14 DIAGNOSIS — I739 Peripheral vascular disease, unspecified: Secondary | ICD-10-CM | POA: Diagnosis not present

## 2013-06-14 DIAGNOSIS — L609 Nail disorder, unspecified: Secondary | ICD-10-CM | POA: Diagnosis not present

## 2013-06-16 DIAGNOSIS — J309 Allergic rhinitis, unspecified: Secondary | ICD-10-CM | POA: Diagnosis not present

## 2013-06-16 DIAGNOSIS — M1990 Unspecified osteoarthritis, unspecified site: Secondary | ICD-10-CM | POA: Diagnosis not present

## 2013-06-16 DIAGNOSIS — F0280 Dementia in other diseases classified elsewhere without behavioral disturbance: Secondary | ICD-10-CM | POA: Diagnosis not present

## 2013-06-24 DIAGNOSIS — R6889 Other general symptoms and signs: Secondary | ICD-10-CM | POA: Diagnosis not present

## 2013-06-24 DIAGNOSIS — M069 Rheumatoid arthritis, unspecified: Secondary | ICD-10-CM | POA: Diagnosis not present

## 2013-06-24 DIAGNOSIS — M1710 Unilateral primary osteoarthritis, unspecified knee: Secondary | ICD-10-CM | POA: Diagnosis not present

## 2013-06-24 DIAGNOSIS — Z09 Encounter for follow-up examination after completed treatment for conditions other than malignant neoplasm: Secondary | ICD-10-CM | POA: Diagnosis not present

## 2013-07-05 NOTE — Progress Notes (Signed)
Reviewed oncology labs. No further labs on our part needed at this time. OV next year with [facility] as planned.

## 2013-07-12 DIAGNOSIS — M1990 Unspecified osteoarthritis, unspecified site: Secondary | ICD-10-CM | POA: Diagnosis not present

## 2013-07-12 DIAGNOSIS — I10 Essential (primary) hypertension: Secondary | ICD-10-CM | POA: Diagnosis not present

## 2013-07-12 DIAGNOSIS — R5381 Other malaise: Secondary | ICD-10-CM | POA: Diagnosis not present

## 2013-07-12 DIAGNOSIS — D649 Anemia, unspecified: Secondary | ICD-10-CM | POA: Diagnosis not present

## 2013-08-01 ENCOUNTER — Other Ambulatory Visit (HOSPITAL_COMMUNITY): Payer: Self-pay | Admitting: Internal Medicine

## 2013-08-01 DIAGNOSIS — Z139 Encounter for screening, unspecified: Secondary | ICD-10-CM

## 2013-08-23 ENCOUNTER — Ambulatory Visit (HOSPITAL_COMMUNITY)
Admission: RE | Admit: 2013-08-23 | Discharge: 2013-08-23 | Disposition: A | Discharge status: Home / Self Care | Payer: Medicare Other | Source: Ambulatory Visit | Attending: Internal Medicine | Admitting: Internal Medicine

## 2013-08-23 DIAGNOSIS — Z1231 Encounter for screening mammogram for malignant neoplasm of breast: Secondary | ICD-10-CM | POA: Insufficient documentation

## 2013-08-23 DIAGNOSIS — I739 Peripheral vascular disease, unspecified: Secondary | ICD-10-CM | POA: Diagnosis not present

## 2013-08-23 DIAGNOSIS — B351 Tinea unguium: Secondary | ICD-10-CM | POA: Diagnosis not present

## 2013-08-23 DIAGNOSIS — L851 Acquired keratosis [keratoderma] palmaris et plantaris: Secondary | ICD-10-CM | POA: Diagnosis not present

## 2013-08-23 DIAGNOSIS — Z139 Encounter for screening, unspecified: Secondary | ICD-10-CM

## 2013-08-31 DIAGNOSIS — M1990 Unspecified osteoarthritis, unspecified site: Secondary | ICD-10-CM | POA: Diagnosis not present

## 2013-08-31 DIAGNOSIS — I10 Essential (primary) hypertension: Secondary | ICD-10-CM | POA: Diagnosis not present

## 2013-08-31 DIAGNOSIS — D649 Anemia, unspecified: Secondary | ICD-10-CM | POA: Diagnosis not present

## 2013-08-31 DIAGNOSIS — R269 Unspecified abnormalities of gait and mobility: Secondary | ICD-10-CM | POA: Diagnosis not present

## 2013-09-09 DIAGNOSIS — M1710 Unilateral primary osteoarthritis, unspecified knee: Secondary | ICD-10-CM | POA: Diagnosis not present

## 2013-10-05 DIAGNOSIS — I10 Essential (primary) hypertension: Secondary | ICD-10-CM | POA: Diagnosis not present

## 2013-10-05 DIAGNOSIS — M1990 Unspecified osteoarthritis, unspecified site: Secondary | ICD-10-CM | POA: Diagnosis not present

## 2013-10-28 ENCOUNTER — Other Ambulatory Visit (HOSPITAL_COMMUNITY): Payer: Medicare Other

## 2013-10-31 DIAGNOSIS — D649 Anemia, unspecified: Secondary | ICD-10-CM | POA: Diagnosis not present

## 2013-10-31 DIAGNOSIS — I10 Essential (primary) hypertension: Secondary | ICD-10-CM | POA: Diagnosis not present

## 2013-10-31 DIAGNOSIS — M1710 Unilateral primary osteoarthritis, unspecified knee: Secondary | ICD-10-CM | POA: Diagnosis not present

## 2013-10-31 DIAGNOSIS — R6889 Other general symptoms and signs: Secondary | ICD-10-CM | POA: Diagnosis not present

## 2013-10-31 DIAGNOSIS — IMO0002 Reserved for concepts with insufficient information to code with codable children: Secondary | ICD-10-CM | POA: Diagnosis not present

## 2013-10-31 DIAGNOSIS — M1990 Unspecified osteoarthritis, unspecified site: Secondary | ICD-10-CM | POA: Diagnosis not present

## 2013-10-31 DIAGNOSIS — Z79899 Other long term (current) drug therapy: Secondary | ICD-10-CM | POA: Diagnosis not present

## 2013-11-01 ENCOUNTER — Other Ambulatory Visit: Payer: Self-pay | Admitting: Gastroenterology

## 2013-11-01 DIAGNOSIS — M1710 Unilateral primary osteoarthritis, unspecified knee: Secondary | ICD-10-CM | POA: Diagnosis not present

## 2013-11-01 DIAGNOSIS — Z09 Encounter for follow-up examination after completed treatment for conditions other than malignant neoplasm: Secondary | ICD-10-CM | POA: Diagnosis not present

## 2013-11-01 DIAGNOSIS — B351 Tinea unguium: Secondary | ICD-10-CM | POA: Diagnosis not present

## 2013-11-01 DIAGNOSIS — M25469 Effusion, unspecified knee: Secondary | ICD-10-CM | POA: Diagnosis not present

## 2013-11-01 DIAGNOSIS — I739 Peripheral vascular disease, unspecified: Secondary | ICD-10-CM | POA: Diagnosis not present

## 2013-11-01 DIAGNOSIS — L851 Acquired keratosis [keratoderma] palmaris et plantaris: Secondary | ICD-10-CM | POA: Diagnosis not present

## 2013-11-01 DIAGNOSIS — M069 Rheumatoid arthritis, unspecified: Secondary | ICD-10-CM | POA: Diagnosis not present

## 2013-11-04 ENCOUNTER — Ambulatory Visit (HOSPITAL_COMMUNITY): Payer: Medicare Other

## 2013-12-07 DIAGNOSIS — I10 Essential (primary) hypertension: Secondary | ICD-10-CM | POA: Diagnosis not present

## 2013-12-07 DIAGNOSIS — K219 Gastro-esophageal reflux disease without esophagitis: Secondary | ICD-10-CM | POA: Diagnosis not present

## 2013-12-07 DIAGNOSIS — M1990 Unspecified osteoarthritis, unspecified site: Secondary | ICD-10-CM | POA: Diagnosis not present

## 2013-12-20 DIAGNOSIS — IMO0002 Reserved for concepts with insufficient information to code with codable children: Secondary | ICD-10-CM | POA: Diagnosis not present

## 2013-12-20 DIAGNOSIS — M1710 Unilateral primary osteoarthritis, unspecified knee: Secondary | ICD-10-CM | POA: Diagnosis not present

## 2013-12-20 NOTE — Progress Notes (Signed)
REVIEWED. OPV JUL 2015 E30 ELEVATED LIVER ENZYMES.

## 2013-12-20 NOTE — Progress Notes (Signed)
REVIEWED.  

## 2013-12-21 NOTE — Progress Notes (Signed)
Reminder in epic °

## 2014-01-05 DIAGNOSIS — M1990 Unspecified osteoarthritis, unspecified site: Secondary | ICD-10-CM | POA: Diagnosis not present

## 2014-01-05 DIAGNOSIS — I10 Essential (primary) hypertension: Secondary | ICD-10-CM | POA: Diagnosis not present

## 2014-01-10 DIAGNOSIS — L851 Acquired keratosis [keratoderma] palmaris et plantaris: Secondary | ICD-10-CM | POA: Diagnosis not present

## 2014-01-10 DIAGNOSIS — B351 Tinea unguium: Secondary | ICD-10-CM | POA: Diagnosis not present

## 2014-01-10 DIAGNOSIS — I739 Peripheral vascular disease, unspecified: Secondary | ICD-10-CM | POA: Diagnosis not present

## 2014-02-09 DIAGNOSIS — I10 Essential (primary) hypertension: Secondary | ICD-10-CM | POA: Diagnosis not present

## 2014-02-09 DIAGNOSIS — M1990 Unspecified osteoarthritis, unspecified site: Secondary | ICD-10-CM | POA: Diagnosis not present

## 2014-02-17 DIAGNOSIS — IMO0002 Reserved for concepts with insufficient information to code with codable children: Secondary | ICD-10-CM | POA: Diagnosis not present

## 2014-02-17 DIAGNOSIS — M1710 Unilateral primary osteoarthritis, unspecified knee: Secondary | ICD-10-CM | POA: Diagnosis not present

## 2014-03-13 DIAGNOSIS — I10 Essential (primary) hypertension: Secondary | ICD-10-CM | POA: Diagnosis not present

## 2014-03-13 DIAGNOSIS — M1990 Unspecified osteoarthritis, unspecified site: Secondary | ICD-10-CM | POA: Diagnosis not present

## 2014-03-13 DIAGNOSIS — G8929 Other chronic pain: Secondary | ICD-10-CM | POA: Diagnosis not present

## 2014-03-21 DIAGNOSIS — L851 Acquired keratosis [keratoderma] palmaris et plantaris: Secondary | ICD-10-CM | POA: Diagnosis not present

## 2014-03-21 DIAGNOSIS — I739 Peripheral vascular disease, unspecified: Secondary | ICD-10-CM | POA: Diagnosis not present

## 2014-03-21 DIAGNOSIS — B351 Tinea unguium: Secondary | ICD-10-CM | POA: Diagnosis not present

## 2014-03-22 DIAGNOSIS — IMO0002 Reserved for concepts with insufficient information to code with codable children: Secondary | ICD-10-CM | POA: Diagnosis not present

## 2014-03-22 DIAGNOSIS — M1710 Unilateral primary osteoarthritis, unspecified knee: Secondary | ICD-10-CM | POA: Diagnosis not present

## 2014-03-31 DIAGNOSIS — M1710 Unilateral primary osteoarthritis, unspecified knee: Secondary | ICD-10-CM | POA: Diagnosis not present

## 2014-03-31 DIAGNOSIS — IMO0002 Reserved for concepts with insufficient information to code with codable children: Secondary | ICD-10-CM | POA: Diagnosis not present

## 2014-04-10 DIAGNOSIS — IMO0002 Reserved for concepts with insufficient information to code with codable children: Secondary | ICD-10-CM | POA: Diagnosis not present

## 2014-04-10 DIAGNOSIS — I10 Essential (primary) hypertension: Secondary | ICD-10-CM | POA: Diagnosis not present

## 2014-04-10 DIAGNOSIS — M1990 Unspecified osteoarthritis, unspecified site: Secondary | ICD-10-CM | POA: Diagnosis not present

## 2014-04-10 DIAGNOSIS — M1710 Unilateral primary osteoarthritis, unspecified knee: Secondary | ICD-10-CM | POA: Diagnosis not present

## 2014-05-04 ENCOUNTER — Encounter: Payer: Self-pay | Admitting: Gastroenterology

## 2014-05-11 DIAGNOSIS — M1990 Unspecified osteoarthritis, unspecified site: Secondary | ICD-10-CM | POA: Diagnosis not present

## 2014-05-11 DIAGNOSIS — G8929 Other chronic pain: Secondary | ICD-10-CM | POA: Diagnosis not present

## 2014-05-11 DIAGNOSIS — I10 Essential (primary) hypertension: Secondary | ICD-10-CM | POA: Diagnosis not present

## 2014-06-06 DIAGNOSIS — L851 Acquired keratosis [keratoderma] palmaris et plantaris: Secondary | ICD-10-CM | POA: Diagnosis not present

## 2014-06-06 DIAGNOSIS — B351 Tinea unguium: Secondary | ICD-10-CM | POA: Diagnosis not present

## 2014-06-06 DIAGNOSIS — I739 Peripheral vascular disease, unspecified: Secondary | ICD-10-CM | POA: Diagnosis not present

## 2014-06-09 DIAGNOSIS — D649 Anemia, unspecified: Secondary | ICD-10-CM | POA: Diagnosis not present

## 2014-06-09 DIAGNOSIS — I10 Essential (primary) hypertension: Secondary | ICD-10-CM | POA: Diagnosis not present

## 2014-06-09 DIAGNOSIS — Z23 Encounter for immunization: Secondary | ICD-10-CM | POA: Diagnosis not present

## 2014-06-09 DIAGNOSIS — M1990 Unspecified osteoarthritis, unspecified site: Secondary | ICD-10-CM | POA: Diagnosis not present

## 2014-06-28 DIAGNOSIS — M1710 Unilateral primary osteoarthritis, unspecified knee: Secondary | ICD-10-CM | POA: Diagnosis not present

## 2014-06-28 DIAGNOSIS — IMO0002 Reserved for concepts with insufficient information to code with codable children: Secondary | ICD-10-CM | POA: Diagnosis not present

## 2014-06-29 ENCOUNTER — Ambulatory Visit (INDEPENDENT_AMBULATORY_CARE_PROVIDER_SITE_OTHER): Payer: Medicare Other | Admitting: Gastroenterology

## 2014-06-29 ENCOUNTER — Encounter: Payer: Self-pay | Admitting: Gastroenterology

## 2014-06-29 VITALS — BP 157/85 | HR 93 | Temp 98.3°F | Ht 63.0 in | Wt 148.2 lb

## 2014-06-29 DIAGNOSIS — R748 Abnormal levels of other serum enzymes: Secondary | ICD-10-CM | POA: Diagnosis not present

## 2014-06-29 DIAGNOSIS — D6489 Other specified anemias: Secondary | ICD-10-CM

## 2014-06-29 NOTE — Progress Notes (Signed)
PATIENT NIC'D  °

## 2014-06-29 NOTE — Assessment & Plan Note (Addendum)
NO SIGNS OR SYMPTOMS OF ACTIVE GI BLEED, MOST LIKELY DUE TO CHRONIC DISEASE.  CBC/FERRITIN/Cr TODAY OPV IN 1 YR

## 2014-06-29 NOTE — Patient Instructions (Signed)
PLEASE COMPLETE YOUR LABS. I WILL CALL YOU WITH THE RESULTS IN 14 DAYS.  I'LL SEE YOU IN ONE YEAR.

## 2014-06-29 NOTE — Progress Notes (Signed)
   Subjective:    Patient ID: [name], female    DOB: [birth year], 80 y.o.   MRN: [id number]  [name], MD  HPI  NOW WALKING WITH A WALKER, BUT DOESN'T LIKE IT.BMs: EVERY DAY. BOTH KNEES HURT.  PT DENIES FEVER, CHILLS, ITCHING, HEMATOCHEZIA, HEMATEMESIS, nausea, vomiting, melena, diarrhea, CHEST PAIN, SHORTNESS OF BREATH,  CHANGE IN BOWEL IN HABITS, constipation, abdominal pain, problems swallowing, OR heartburn or indigestion.  Past Medical History  Diagnosis Date  . Memory loss   . Leukopenia   . Hyponatremia   . Other and unspecified hyperlipidemia   . Proteinuria   . Edema   . Unspecified asthma, with exacerbation   . Arthropathy, unspecified, site unspecified   . Unspecified essential hypertension   . S/P colonoscopy Jan 2012    anal papilla, internal hemorrhoids, melanosis coli  . Hyperlipidemia    Past Surgical History  Procedure Laterality Date  . None    . Esophagogastroduodenoscopy  06/16/2011    Dr. Oneida Alar: moderate gastritis, no h.pylori  . Colonoscopy  11/05/2010    Dr. Gala Romney: Anal papilla and internal hemorrhoids pigmented rectal mucosa consistent with melanosis coli/ Diffuse pigmentation of the colonic mucosa consistent with melanosis coli, localized cecal diverticula as described above    No Known Allergies  Current Outpatient Prescriptions  Medication Sig Dispense Refill  . acetaminophen (TYLENOL) 325 MG tablet Take 650 mg by mouth every 6 (six) hours as needed.        Marland Kitchen amLODipine (NORVASC) 5 MG tablet Take 5 mg by mouth daily.      . Colesevelam HCl (WELCHOL PO) Take by mouth daily.       . furosemide (LASIX) 40 MG tablet Take 40 mg by mouth as needed. Take one tablet by mouth every 4 days as needed for fluid  NOT REALLY NEEDED    . HYDROCODONE-ACETAMINOPHEN PO Take 1 tablet by mouth 2 (two) times daily as needed.       . IRON PO Take 325 mg by mouth 2 (two) times daily.     Marland Kitchen omeprazole (PRILOSEC) 20 MG capsule TAKE 1 TABLET BY MOUTH EVERY  MORNING    . polyethylene glycol (MIRALAX / GLYCOLAX) packet Take 17 g by mouth daily as needed.      . ramipril (ALTACE) 10 MG capsule Take 10 mg by mouth daily.         Review of Systems     Objective:   Physical Exam        Assessment & Plan:

## 2014-06-29 NOTE — Assessment & Plan Note (Signed)
ETIOLOGY UNCLEAR IN SETTING OF OSTEOARTHRITIS  CMP TODAY OPV IN 1 YR

## 2014-06-29 NOTE — Progress Notes (Signed)
cc'ed to pcp °

## 2014-06-30 LAB — CBC WITH DIFFERENTIAL/PLATELET
Basophils Absolute: 0 10*3/uL (ref 0.0–0.1)
Basophils Relative: 0 % (ref 0–1)
Eosinophils Absolute: 0 10*3/uL (ref 0.0–0.7)
Eosinophils Relative: 0 % (ref 0–5)
HCT: 39.8 % (ref 36.0–46.0)
Hemoglobin: 13.7 g/dL (ref 12.0–15.0)
LYMPHS ABS: 0.7 10*3/uL (ref 0.7–4.0)
LYMPHS PCT: 7 % — AB (ref 12–46)
MCH: 32 pg (ref 26.0–34.0)
MCHC: 34.4 g/dL (ref 30.0–36.0)
MCV: 93 fL (ref 78.0–100.0)
Monocytes Absolute: 0.3 10*3/uL (ref 0.1–1.0)
Monocytes Relative: 3 % (ref 3–12)
NEUTROS PCT: 90 % — AB (ref 43–77)
Neutro Abs: 9.3 10*3/uL — ABNORMAL HIGH (ref 1.7–7.7)
PLATELETS: 226 10*3/uL (ref 150–400)
RBC: 4.28 MIL/uL (ref 3.87–5.11)
RDW: 13.6 % (ref 11.5–15.5)
WBC: 10.3 10*3/uL (ref 4.0–10.5)

## 2014-07-10 DIAGNOSIS — I10 Essential (primary) hypertension: Secondary | ICD-10-CM | POA: Diagnosis not present

## 2014-07-10 DIAGNOSIS — G8929 Other chronic pain: Secondary | ICD-10-CM | POA: Diagnosis not present

## 2014-07-27 NOTE — Progress Notes (Signed)
PLEASE CALL PT. HER BLOOD COUNT IS NORMAL. 

## 2014-07-28 NOTE — Progress Notes (Signed)
Called many rings and no answer. Will mail letter normal results.

## 2014-08-10 DIAGNOSIS — M1990 Unspecified osteoarthritis, unspecified site: Secondary | ICD-10-CM | POA: Diagnosis not present

## 2014-08-10 DIAGNOSIS — I10 Essential (primary) hypertension: Secondary | ICD-10-CM | POA: Diagnosis not present

## 2014-08-15 DIAGNOSIS — B351 Tinea unguium: Secondary | ICD-10-CM | POA: Diagnosis not present

## 2014-08-15 DIAGNOSIS — I739 Peripheral vascular disease, unspecified: Secondary | ICD-10-CM | POA: Diagnosis not present

## 2014-08-15 DIAGNOSIS — L851 Acquired keratosis [keratoderma] palmaris et plantaris: Secondary | ICD-10-CM | POA: Diagnosis not present

## 2014-08-21 ENCOUNTER — Other Ambulatory Visit (HOSPITAL_COMMUNITY): Payer: Self-pay | Admitting: Internal Medicine

## 2014-08-21 DIAGNOSIS — Z1231 Encounter for screening mammogram for malignant neoplasm of breast: Secondary | ICD-10-CM

## 2014-08-28 DIAGNOSIS — M1712 Unilateral primary osteoarthritis, left knee: Secondary | ICD-10-CM | POA: Diagnosis not present

## 2014-08-28 DIAGNOSIS — M1711 Unilateral primary osteoarthritis, right knee: Secondary | ICD-10-CM | POA: Diagnosis not present

## 2014-09-06 ENCOUNTER — Ambulatory Visit (HOSPITAL_COMMUNITY)
Admission: RE | Admit: 2014-09-06 | Discharge: 2014-09-06 | Disposition: A | Discharge status: Home / Self Care | Payer: Medicare Other | Source: Ambulatory Visit | Attending: Internal Medicine | Admitting: Internal Medicine

## 2014-09-06 DIAGNOSIS — Z1231 Encounter for screening mammogram for malignant neoplasm of breast: Secondary | ICD-10-CM | POA: Diagnosis not present

## 2014-09-08 DIAGNOSIS — D649 Anemia, unspecified: Secondary | ICD-10-CM | POA: Diagnosis not present

## 2014-09-08 DIAGNOSIS — M1990 Unspecified osteoarthritis, unspecified site: Secondary | ICD-10-CM | POA: Diagnosis not present

## 2014-09-08 DIAGNOSIS — I10 Essential (primary) hypertension: Secondary | ICD-10-CM | POA: Diagnosis not present

## 2014-10-09 DIAGNOSIS — I10 Essential (primary) hypertension: Secondary | ICD-10-CM | POA: Diagnosis not present

## 2014-10-09 DIAGNOSIS — E784 Other hyperlipidemia: Secondary | ICD-10-CM | POA: Diagnosis not present

## 2014-10-09 DIAGNOSIS — M1990 Unspecified osteoarthritis, unspecified site: Secondary | ICD-10-CM | POA: Diagnosis not present

## 2014-10-23 DIAGNOSIS — M1712 Unilateral primary osteoarthritis, left knee: Secondary | ICD-10-CM | POA: Diagnosis not present

## 2014-10-23 DIAGNOSIS — M1711 Unilateral primary osteoarthritis, right knee: Secondary | ICD-10-CM | POA: Diagnosis not present

## 2014-10-24 DIAGNOSIS — L851 Acquired keratosis [keratoderma] palmaris et plantaris: Secondary | ICD-10-CM | POA: Diagnosis not present

## 2014-10-24 DIAGNOSIS — I739 Peripheral vascular disease, unspecified: Secondary | ICD-10-CM | POA: Diagnosis not present

## 2014-10-24 DIAGNOSIS — B351 Tinea unguium: Secondary | ICD-10-CM | POA: Diagnosis not present

## 2014-11-03 ENCOUNTER — Other Ambulatory Visit: Payer: Self-pay | Admitting: Gastroenterology

## 2014-11-04 ENCOUNTER — Other Ambulatory Visit: Payer: Self-pay | Admitting: Gastroenterology

## 2014-11-09 DIAGNOSIS — M1990 Unspecified osteoarthritis, unspecified site: Secondary | ICD-10-CM | POA: Diagnosis not present

## 2014-11-09 DIAGNOSIS — R2689 Other abnormalities of gait and mobility: Secondary | ICD-10-CM | POA: Diagnosis not present

## 2014-11-09 DIAGNOSIS — I10 Essential (primary) hypertension: Secondary | ICD-10-CM | POA: Diagnosis not present

## 2014-12-07 DIAGNOSIS — M1990 Unspecified osteoarthritis, unspecified site: Secondary | ICD-10-CM | POA: Diagnosis not present

## 2014-12-07 DIAGNOSIS — I10 Essential (primary) hypertension: Secondary | ICD-10-CM | POA: Diagnosis not present

## 2014-12-07 DIAGNOSIS — R52 Pain, unspecified: Secondary | ICD-10-CM | POA: Diagnosis not present

## 2014-12-11 DIAGNOSIS — M1711 Unilateral primary osteoarthritis, right knee: Secondary | ICD-10-CM | POA: Diagnosis not present

## 2014-12-11 DIAGNOSIS — M1712 Unilateral primary osteoarthritis, left knee: Secondary | ICD-10-CM | POA: Diagnosis not present

## 2014-12-26 DIAGNOSIS — I739 Peripheral vascular disease, unspecified: Secondary | ICD-10-CM | POA: Diagnosis not present

## 2014-12-26 DIAGNOSIS — B351 Tinea unguium: Secondary | ICD-10-CM | POA: Diagnosis not present

## 2014-12-26 DIAGNOSIS — L851 Acquired keratosis [keratoderma] palmaris et plantaris: Secondary | ICD-10-CM | POA: Diagnosis not present

## 2015-01-08 DIAGNOSIS — D649 Anemia, unspecified: Secondary | ICD-10-CM | POA: Diagnosis not present

## 2015-01-08 DIAGNOSIS — I10 Essential (primary) hypertension: Secondary | ICD-10-CM | POA: Diagnosis not present

## 2015-01-08 DIAGNOSIS — M1990 Unspecified osteoarthritis, unspecified site: Secondary | ICD-10-CM | POA: Diagnosis not present

## 2015-01-29 DIAGNOSIS — M170 Bilateral primary osteoarthritis of knee: Secondary | ICD-10-CM | POA: Diagnosis not present

## 2015-02-06 DIAGNOSIS — N189 Chronic kidney disease, unspecified: Secondary | ICD-10-CM | POA: Diagnosis not present

## 2015-02-06 DIAGNOSIS — E780 Pure hypercholesterolemia: Secondary | ICD-10-CM | POA: Diagnosis not present

## 2015-02-06 DIAGNOSIS — D649 Anemia, unspecified: Secondary | ICD-10-CM | POA: Diagnosis not present

## 2015-02-06 DIAGNOSIS — E785 Hyperlipidemia, unspecified: Secondary | ICD-10-CM | POA: Diagnosis not present

## 2015-02-06 DIAGNOSIS — F410 Panic disorder [episodic paroxysmal anxiety] without agoraphobia: Secondary | ICD-10-CM | POA: Diagnosis not present

## 2015-02-06 DIAGNOSIS — I10 Essential (primary) hypertension: Secondary | ICD-10-CM | POA: Diagnosis not present

## 2015-02-06 DIAGNOSIS — M1990 Unspecified osteoarthritis, unspecified site: Secondary | ICD-10-CM | POA: Diagnosis not present

## 2015-02-06 DIAGNOSIS — R809 Proteinuria, unspecified: Secondary | ICD-10-CM | POA: Diagnosis not present

## 2015-03-13 DIAGNOSIS — Z0000 Encounter for general adult medical examination without abnormal findings: Secondary | ICD-10-CM | POA: Diagnosis not present

## 2015-03-13 DIAGNOSIS — E785 Hyperlipidemia, unspecified: Secondary | ICD-10-CM | POA: Diagnosis not present

## 2015-03-13 DIAGNOSIS — I10 Essential (primary) hypertension: Secondary | ICD-10-CM | POA: Diagnosis not present

## 2015-03-13 DIAGNOSIS — R2689 Other abnormalities of gait and mobility: Secondary | ICD-10-CM | POA: Diagnosis not present

## 2015-03-16 DIAGNOSIS — M1712 Unilateral primary osteoarthritis, left knee: Secondary | ICD-10-CM | POA: Diagnosis not present

## 2015-03-16 DIAGNOSIS — M1711 Unilateral primary osteoarthritis, right knee: Secondary | ICD-10-CM | POA: Diagnosis not present

## 2015-03-27 DIAGNOSIS — L851 Acquired keratosis [keratoderma] palmaris et plantaris: Secondary | ICD-10-CM | POA: Diagnosis not present

## 2015-03-27 DIAGNOSIS — I739 Peripheral vascular disease, unspecified: Secondary | ICD-10-CM | POA: Diagnosis not present

## 2015-03-27 DIAGNOSIS — B351 Tinea unguium: Secondary | ICD-10-CM | POA: Diagnosis not present

## 2015-04-17 DIAGNOSIS — M1990 Unspecified osteoarthritis, unspecified site: Secondary | ICD-10-CM | POA: Diagnosis not present

## 2015-04-17 DIAGNOSIS — I10 Essential (primary) hypertension: Secondary | ICD-10-CM | POA: Diagnosis not present

## 2015-05-07 ENCOUNTER — Encounter: Payer: Self-pay | Admitting: Gastroenterology

## 2015-05-08 DIAGNOSIS — E785 Hyperlipidemia, unspecified: Secondary | ICD-10-CM | POA: Diagnosis not present

## 2015-05-08 DIAGNOSIS — M1990 Unspecified osteoarthritis, unspecified site: Secondary | ICD-10-CM | POA: Diagnosis not present

## 2015-05-08 DIAGNOSIS — I10 Essential (primary) hypertension: Secondary | ICD-10-CM | POA: Diagnosis not present

## 2015-05-22 DIAGNOSIS — M170 Bilateral primary osteoarthritis of knee: Secondary | ICD-10-CM | POA: Diagnosis not present

## 2015-06-05 DIAGNOSIS — I739 Peripheral vascular disease, unspecified: Secondary | ICD-10-CM | POA: Diagnosis not present

## 2015-06-05 DIAGNOSIS — B351 Tinea unguium: Secondary | ICD-10-CM | POA: Diagnosis not present

## 2015-06-05 DIAGNOSIS — L851 Acquired keratosis [keratoderma] palmaris et plantaris: Secondary | ICD-10-CM | POA: Diagnosis not present

## 2015-06-12 DIAGNOSIS — M1990 Unspecified osteoarthritis, unspecified site: Secondary | ICD-10-CM | POA: Diagnosis not present

## 2015-06-12 DIAGNOSIS — I10 Essential (primary) hypertension: Secondary | ICD-10-CM | POA: Diagnosis not present

## 2015-06-12 DIAGNOSIS — Z23 Encounter for immunization: Secondary | ICD-10-CM | POA: Diagnosis not present

## 2015-07-05 ENCOUNTER — Ambulatory Visit (INDEPENDENT_AMBULATORY_CARE_PROVIDER_SITE_OTHER): Payer: Medicare Other | Admitting: Gastroenterology

## 2015-07-05 ENCOUNTER — Encounter: Payer: Self-pay | Admitting: Gastroenterology

## 2015-07-05 VITALS — BP 176/86 | HR 85 | Temp 99.1°F | Ht 65.0 in | Wt 145.2 lb

## 2015-07-05 DIAGNOSIS — D6489 Other specified anemias: Secondary | ICD-10-CM

## 2015-07-05 DIAGNOSIS — R748 Abnormal levels of other serum enzymes: Secondary | ICD-10-CM

## 2015-07-05 MED ORDER — OMEPRAZOLE 20 MG PO CPDR: 20.0000 mg | DELAYED_RELEASE_CAPSULE | Freq: Every morning | ORAL | Status: DC

## 2015-07-05 NOTE — Progress Notes (Signed)
CC'D TO PCP °

## 2015-07-05 NOTE — Assessment & Plan Note (Addendum)
NO WARNING SIGNS/SYMPTOMS-LIKELY DUE TO OSTEOARTHRITIS  CHECK HFP IN 2016. FOLLOW UP IN 1 YEAR.

## 2015-07-05 NOTE — Assessment & Plan Note (Signed)
NO BRBPR OR MELENA. NL Hb 2015.  CONTINUE TO MONITOR SYMPTOMS. GET LABS FROM DR. FANTA. OPV IN 1 YEAR

## 2015-07-05 NOTE — Progress Notes (Signed)
   Subjective:    Patient ID: [name], female    DOB: 03-10-1922, 81 y.o.   MRN: [id number] Brianna Fire, MD   HPI No questions or concerns. FEELS LIKE SHE'S URINATING A LOT. TAKES LASIX 2-3 TIMES A WEEK. BMs: PRETTY GOOD, RARE MIRALAX. JUST PAIN IN HER KNEES. STILL WORKING AT [employer]. APPETITE: GOOD.  PT DENIES FEVER, CHILLS, HEMATOCHEZIA, nausea, vomiting, melena, diarrhea, CHEST PAIN, SHORTNESS OF BREATH, CHANGE IN BOWEL IN HABITS, abdominal pain, problems swallowing, OR heartburn or indigestion.  Past Medical History  Diagnosis Date  . Memory loss   . Leukopenia   . Hyponatremia   . Other and unspecified hyperlipidemia   . Proteinuria   . Edema   . Unspecified asthma, with exacerbation   . Arthropathy, unspecified, site unspecified   . Unspecified essential hypertension   . S/P colonoscopy Jan 2012    anal papilla, internal hemorrhoids, melanosis coli  . Hyperlipidemia    Past Surgical History  Procedure Laterality Date  . None    . Esophagogastroduodenoscopy  06/16/2011    Dr. Oneida Alar: moderate gastritis, no h.pylori  . Colonoscopy  11/05/2010    Dr. Gala Romney: Anal papilla and internal hemorrhoids pigmented rectal mucosa consistent with melanosis coli/ Diffuse pigmentation of the colonic mucosa consistent with melanosis coli, localized cecal diverticula as described above    No Known Allergies  Current Outpatient Prescriptions  Medication Sig Dispense Refill  . acetaminophen (TYLENOL) 325 MG tablet Take 650 mg by mouth every 6 (six) hours as needed.      Marland Kitchen amLODipine (NORVASC) 5 MG tablet Take 5 mg by mouth daily.    . Colesevelam HCl (WELCHOL PO) Take by mouth daily.     . furosemide (LASIX) 40 MG tablet Take 40 mg by mouth as needed. Take one tablet by mouth every 4 days as needed for fluid    . HYDROCODONE-ACETAMINOPHEN PO Take 1 tablet by mouth 2 (two) times daily as needed.     . IRON PO Take 325 mg by mouth 2 (two) times daily.     Marland Kitchen omeprazole  (PRILOSEC) 20 MG capsule Take 1 capsule (20 mg total) by mouth every morning. 30 minutes before breakfast. 30 capsule 7  . polyethylene glycol (MIRALAX / GLYCOLAX) packet Take 17 g by mouth daily as needed.    . ramipril (ALTACE) 10 MG capsule Take 10 mg by mouth daily.     Review of Systems PER HPI OTHERWISE ALL SYSTEMS ARE NEGATIVE.    Objective:   Physical Exam  Constitutional: She is oriented to person, place, and time. She appears well-developed and well-nourished. No distress.  HENT:  Head: Normocephalic and atraumatic.  Mouth/Throat: Oropharynx is clear and moist. No oropharyngeal exudate.  Eyes: Pupils are equal, round, and reactive to light. No scleral icterus.  Neck: Normal range of motion. Neck supple.  Cardiovascular: Normal rate, regular rhythm and normal heart sounds.   Pulmonary/Chest: Effort normal and breath sounds normal. No respiratory distress.  Abdominal: Soft. Bowel sounds are normal. She exhibits no distension. There is no tenderness.  Musculoskeletal: She exhibits edema.  WALKS ASSISTED WITH A CANE. ASSISTED WITH A WALKER  Lymphadenopathy:    She has no cervical adenopathy.  Neurological: She is alert and oriented to person, place, and time.  NO FOCAL DEFICITS   Psychiatric: She has a normal mood and affect.  Vitals reviewed.         Assessment & Plan:

## 2015-07-05 NOTE — Progress Notes (Signed)
ON RECALL  °

## 2015-07-05 NOTE — Patient Instructions (Signed)
CONTINUE OMEPRAZOLE.  TAKE 30 MINUTES PRIOR TO YOUR FIRST MEAL. I REFILLED IT FOR ONE YEAR.  COMPLETE YOUR LAB/LIVER TESTS WHEN YOU GET BLOOD DRAWN FROM DR. FANTA.  FOLLOW UP IN 1 YEAR. MERRY CHRISTMAS AND HAPPY NEW YEAR!

## 2015-07-19 DIAGNOSIS — D649 Anemia, unspecified: Secondary | ICD-10-CM | POA: Diagnosis not present

## 2015-07-19 DIAGNOSIS — I10 Essential (primary) hypertension: Secondary | ICD-10-CM | POA: Diagnosis not present

## 2015-07-19 DIAGNOSIS — M1990 Unspecified osteoarthritis, unspecified site: Secondary | ICD-10-CM | POA: Diagnosis not present

## 2015-07-19 DIAGNOSIS — R2689 Other abnormalities of gait and mobility: Secondary | ICD-10-CM | POA: Diagnosis not present

## 2015-07-24 ENCOUNTER — Telehealth (HOSPITAL_COMMUNITY): Payer: Self-pay | Admitting: Physical Therapy

## 2015-07-24 NOTE — Telephone Encounter (Signed)
Caregiver returned call to clinic, reported that she had received a call from this number and didn't know what it was about. Advised that the patient has an upcoming evaluation as well as time and date of the evaluation; caregiver was curious about what wheelchair evaluation entailed and was encouraged to bring all questions to OT during eval tomorrow.   Deniece Ree PT, DPT [phone]

## 2015-07-25 ENCOUNTER — Ambulatory Visit (HOSPITAL_COMMUNITY): Payer: Medicare Other | Attending: Internal Medicine

## 2015-08-06 DIAGNOSIS — M1712 Unilateral primary osteoarthritis, left knee: Secondary | ICD-10-CM | POA: Diagnosis not present

## 2015-08-06 DIAGNOSIS — M1711 Unilateral primary osteoarthritis, right knee: Secondary | ICD-10-CM | POA: Diagnosis not present

## 2015-08-16 DIAGNOSIS — M1990 Unspecified osteoarthritis, unspecified site: Secondary | ICD-10-CM | POA: Diagnosis not present

## 2015-08-16 DIAGNOSIS — M21961 Unspecified acquired deformity of right lower leg: Secondary | ICD-10-CM | POA: Diagnosis not present

## 2015-08-16 DIAGNOSIS — R2681 Unsteadiness on feet: Secondary | ICD-10-CM | POA: Diagnosis not present

## 2015-08-16 DIAGNOSIS — R52 Pain, unspecified: Secondary | ICD-10-CM | POA: Diagnosis not present

## 2015-08-21 DIAGNOSIS — I739 Peripheral vascular disease, unspecified: Secondary | ICD-10-CM | POA: Diagnosis not present

## 2015-08-21 DIAGNOSIS — B351 Tinea unguium: Secondary | ICD-10-CM | POA: Diagnosis not present

## 2015-08-21 DIAGNOSIS — L851 Acquired keratosis [keratoderma] palmaris et plantaris: Secondary | ICD-10-CM | POA: Diagnosis not present

## 2015-09-13 DIAGNOSIS — M170 Bilateral primary osteoarthritis of knee: Secondary | ICD-10-CM | POA: Diagnosis not present

## 2015-09-13 DIAGNOSIS — I10 Essential (primary) hypertension: Secondary | ICD-10-CM | POA: Diagnosis not present

## 2015-10-09 DIAGNOSIS — M1711 Unilateral primary osteoarthritis, right knee: Secondary | ICD-10-CM | POA: Diagnosis not present

## 2015-10-09 DIAGNOSIS — M1712 Unilateral primary osteoarthritis, left knee: Secondary | ICD-10-CM | POA: Diagnosis not present

## 2015-10-18 DIAGNOSIS — I10 Essential (primary) hypertension: Secondary | ICD-10-CM | POA: Diagnosis not present

## 2015-10-18 DIAGNOSIS — M170 Bilateral primary osteoarthritis of knee: Secondary | ICD-10-CM | POA: Diagnosis not present

## 2015-11-06 DIAGNOSIS — L851 Acquired keratosis [keratoderma] palmaris et plantaris: Secondary | ICD-10-CM | POA: Diagnosis not present

## 2015-11-06 DIAGNOSIS — I739 Peripheral vascular disease, unspecified: Secondary | ICD-10-CM | POA: Diagnosis not present

## 2015-11-06 DIAGNOSIS — B351 Tinea unguium: Secondary | ICD-10-CM | POA: Diagnosis not present

## 2015-11-22 DIAGNOSIS — M170 Bilateral primary osteoarthritis of knee: Secondary | ICD-10-CM | POA: Diagnosis not present

## 2015-11-22 DIAGNOSIS — I10 Essential (primary) hypertension: Secondary | ICD-10-CM | POA: Diagnosis not present

## 2015-12-07 DIAGNOSIS — M179 Osteoarthritis of knee, unspecified: Secondary | ICD-10-CM | POA: Diagnosis not present

## 2015-12-07 DIAGNOSIS — M1712 Unilateral primary osteoarthritis, left knee: Secondary | ICD-10-CM | POA: Diagnosis not present

## 2015-12-07 DIAGNOSIS — M1711 Unilateral primary osteoarthritis, right knee: Secondary | ICD-10-CM | POA: Diagnosis not present

## 2015-12-09 ENCOUNTER — Emergency Department (HOSPITAL_COMMUNITY): Payer: Medicare Other

## 2015-12-09 ENCOUNTER — Inpatient Hospital Stay (HOSPITAL_COMMUNITY)
Admission: EM | Admit: 2015-12-09 | Discharge: 2015-12-12 | DRG: 069 | Disposition: A | Discharge status: Skilled Nursing Facility | Payer: Medicare Other | Attending: Neurology | Admitting: Neurology

## 2015-12-09 ENCOUNTER — Encounter (HOSPITAL_COMMUNITY): Payer: Self-pay | Admitting: Emergency Medicine

## 2015-12-09 DIAGNOSIS — K068 Other specified disorders of gingiva and edentulous alveolar ridge: Secondary | ICD-10-CM | POA: Diagnosis not present

## 2015-12-09 DIAGNOSIS — Z79891 Long term (current) use of opiate analgesic: Secondary | ICD-10-CM

## 2015-12-09 DIAGNOSIS — E785 Hyperlipidemia, unspecified: Secondary | ICD-10-CM | POA: Diagnosis present

## 2015-12-09 DIAGNOSIS — F17210 Nicotine dependence, cigarettes, uncomplicated: Secondary | ICD-10-CM | POA: Diagnosis present

## 2015-12-09 DIAGNOSIS — R41841 Cognitive communication deficit: Secondary | ICD-10-CM | POA: Diagnosis not present

## 2015-12-09 DIAGNOSIS — N189 Chronic kidney disease, unspecified: Secondary | ICD-10-CM | POA: Diagnosis present

## 2015-12-09 DIAGNOSIS — M21371 Foot drop, right foot: Secondary | ICD-10-CM | POA: Diagnosis present

## 2015-12-09 DIAGNOSIS — M6281 Muscle weakness (generalized): Secondary | ICD-10-CM | POA: Diagnosis not present

## 2015-12-09 DIAGNOSIS — I129 Hypertensive chronic kidney disease with stage 1 through stage 4 chronic kidney disease, or unspecified chronic kidney disease: Secondary | ICD-10-CM | POA: Diagnosis present

## 2015-12-09 DIAGNOSIS — Z79899 Other long term (current) drug therapy: Secondary | ICD-10-CM | POA: Diagnosis not present

## 2015-12-09 DIAGNOSIS — I10 Essential (primary) hypertension: Secondary | ICD-10-CM | POA: Diagnosis not present

## 2015-12-09 DIAGNOSIS — I63119 Cerebral infarction due to embolism of unspecified vertebral artery: Secondary | ICD-10-CM | POA: Diagnosis not present

## 2015-12-09 DIAGNOSIS — I639 Cerebral infarction, unspecified: Secondary | ICD-10-CM | POA: Diagnosis not present

## 2015-12-09 DIAGNOSIS — R262 Difficulty in walking, not elsewhere classified: Secondary | ICD-10-CM | POA: Diagnosis not present

## 2015-12-09 DIAGNOSIS — I999 Unspecified disorder of circulatory system: Secondary | ICD-10-CM | POA: Diagnosis not present

## 2015-12-09 DIAGNOSIS — Z9282 Status post administration of tPA (rtPA) in a different facility within the last 24 hours prior to admission to current facility: Secondary | ICD-10-CM

## 2015-12-09 DIAGNOSIS — R413 Other amnesia: Secondary | ICD-10-CM | POA: Diagnosis present

## 2015-12-09 DIAGNOSIS — R531 Weakness: Secondary | ICD-10-CM | POA: Diagnosis not present

## 2015-12-09 DIAGNOSIS — R930 Abnormal findings on diagnostic imaging of skull and head, not elsewhere classified: Secondary | ICD-10-CM | POA: Diagnosis not present

## 2015-12-09 DIAGNOSIS — K5909 Other constipation: Secondary | ICD-10-CM | POA: Diagnosis not present

## 2015-12-09 DIAGNOSIS — Z87891 Personal history of nicotine dependence: Secondary | ICD-10-CM | POA: Diagnosis not present

## 2015-12-09 DIAGNOSIS — M6289 Other specified disorders of muscle: Secondary | ICD-10-CM | POA: Diagnosis not present

## 2015-12-09 DIAGNOSIS — G459 Transient cerebral ischemic attack, unspecified: Secondary | ICD-10-CM | POA: Diagnosis present

## 2015-12-09 DIAGNOSIS — I6789 Other cerebrovascular disease: Secondary | ICD-10-CM | POA: Diagnosis not present

## 2015-12-09 DIAGNOSIS — R278 Other lack of coordination: Secondary | ICD-10-CM | POA: Diagnosis not present

## 2015-12-09 LAB — DIFFERENTIAL
BASOS ABS: 0 10*3/uL (ref 0.0–0.1)
Basophils Relative: 0 %
EOS ABS: 0 10*3/uL (ref 0.0–0.7)
Eosinophils Relative: 0 %
LYMPHS ABS: 1 10*3/uL (ref 0.7–4.0)
LYMPHS PCT: 8 %
Monocytes Absolute: 1.1 10*3/uL — ABNORMAL HIGH (ref 0.1–1.0)
Monocytes Relative: 9 %
NEUTROS PCT: 83 %
Neutro Abs: 10.3 10*3/uL — ABNORMAL HIGH (ref 1.7–7.7)

## 2015-12-09 LAB — URINALYSIS, ROUTINE W REFLEX MICROSCOPIC
BILIRUBIN URINE: NEGATIVE
GLUCOSE, UA: NEGATIVE mg/dL
KETONES UR: NEGATIVE mg/dL
Nitrite: NEGATIVE
PROTEIN: 100 mg/dL — AB
Specific Gravity, Urine: 1.025 (ref 1.005–1.030)
pH: 5.5 (ref 5.0–8.0)

## 2015-12-09 LAB — COMPREHENSIVE METABOLIC PANEL
ALBUMIN: 3.8 g/dL (ref 3.5–5.0)
ALT: 24 U/L (ref 14–54)
AST: 38 U/L (ref 15–41)
Alkaline Phosphatase: 193 U/L — ABNORMAL HIGH (ref 38–126)
Anion gap: 9 (ref 5–15)
BILIRUBIN TOTAL: 0.6 mg/dL (ref 0.3–1.2)
BUN: 36 mg/dL — AB (ref 6–20)
CHLORIDE: 109 mmol/L (ref 101–111)
CO2: 22 mmol/L (ref 22–32)
Calcium: 9.6 mg/dL (ref 8.9–10.3)
Creatinine, Ser: 1.25 mg/dL — ABNORMAL HIGH (ref 0.44–1.00)
GFR calc Af Amer: 42 mL/min — ABNORMAL LOW (ref >60)
GFR calc non Af Amer: 36 mL/min — ABNORMAL LOW (ref >60)
GLUCOSE: 129 mg/dL — AB (ref 65–99)
POTASSIUM: 3.9 mmol/L (ref 3.5–5.1)
Sodium: 140 mmol/L (ref 135–145)
TOTAL PROTEIN: 7.8 g/dL (ref 6.5–8.1)

## 2015-12-09 LAB — URINE MICROSCOPIC-ADD ON

## 2015-12-09 LAB — I-STAT CHEM 8, ED
BUN: 35 mg/dL — ABNORMAL HIGH (ref 6–20)
CHLORIDE: 108 mmol/L (ref 101–111)
Calcium, Ion: 1.32 mmol/L — ABNORMAL HIGH (ref 1.13–1.30)
Creatinine, Ser: 1.2 mg/dL — ABNORMAL HIGH (ref 0.44–1.00)
GLUCOSE: 124 mg/dL — AB (ref 65–99)
HEMATOCRIT: 44 % (ref 36.0–46.0)
Hemoglobin: 15 g/dL (ref 12.0–15.0)
POTASSIUM: 3.8 mmol/L (ref 3.5–5.1)
SODIUM: 143 mmol/L (ref 135–145)
TCO2: 20 mmol/L (ref 0–100)

## 2015-12-09 LAB — PROTIME-INR
INR: 0.99 (ref 0.00–1.49)
Prothrombin Time: 13.3 seconds (ref 11.6–15.2)

## 2015-12-09 LAB — APTT: APTT: 21 s — AB (ref 24–37)

## 2015-12-09 LAB — CBC
HCT: 39.3 % (ref 36.0–46.0)
HEMOGLOBIN: 13.2 g/dL (ref 12.0–15.0)
MCH: 31.4 pg (ref 26.0–34.0)
MCHC: 33.6 g/dL (ref 30.0–36.0)
MCV: 93.3 fL (ref 78.0–100.0)
Platelets: 199 10*3/uL (ref 150–400)
RBC: 4.21 MIL/uL (ref 3.87–5.11)
RDW: 13.8 % (ref 11.5–15.5)
WBC: 12.4 10*3/uL — AB (ref 4.0–10.5)

## 2015-12-09 LAB — GLUCOSE, CAPILLARY: GLUCOSE-CAPILLARY: 162 mg/dL — AB (ref 65–99)

## 2015-12-09 LAB — ETHANOL: Alcohol, Ethyl (B): <5 mg/dL (ref <5)

## 2015-12-09 LAB — RAPID URINE DRUG SCREEN, HOSP PERFORMED
AMPHETAMINES: NOT DETECTED
BARBITURATES: NOT DETECTED
BENZODIAZEPINES: NOT DETECTED
Cocaine: NOT DETECTED
Opiates: POSITIVE — AB
TETRAHYDROCANNABINOL: NOT DETECTED

## 2015-12-09 LAB — MRSA PCR SCREENING: MRSA by PCR: NEGATIVE

## 2015-12-09 LAB — CBG MONITORING, ED: Glucose-Capillary: 125 mg/dL — ABNORMAL HIGH (ref 65–99)

## 2015-12-09 LAB — I-STAT TROPONIN, ED: Troponin i, poc: 0.03 ng/mL (ref 0.00–0.08)

## 2015-12-09 MED ORDER — SODIUM CHLORIDE 0.9 % IV SOLN
INTRAVENOUS | Status: DC
  Administered 2015-12-09: 10 mL/h via INTRAVENOUS

## 2015-12-09 MED ORDER — LABETALOL HCL 5 MG/ML IV SOLN
10.0000 mg | INTRAVENOUS | Status: DC | PRN
  Administered 2015-12-09: 10 mg via INTRAVENOUS
  Filled 2015-12-09: qty 4

## 2015-12-09 MED ORDER — STROKE: EARLY STAGES OF RECOVERY BOOK
Freq: Once | Status: AC
  Administered 2015-12-09: 1
  Filled 2015-12-09: qty 1

## 2015-12-09 MED ORDER — PANTOPRAZOLE SODIUM 40 MG IV SOLR
40.0000 mg | Freq: Every day | INTRAVENOUS | Status: DC
  Administered 2015-12-09 – 2015-12-10 (×2): 40 mg via INTRAVENOUS
  Filled 2015-12-09 (×2): qty 40

## 2015-12-09 MED ORDER — SODIUM CHLORIDE 0.9 % IV SOLN
50.0000 mL | Freq: Once | INTRAVENOUS | Status: AC
  Administered 2015-12-09: 50 mL via INTRAVENOUS

## 2015-12-09 MED ORDER — ALTEPLASE 100 MG IV SOLR
INTRAVENOUS | Status: AC
  Filled 2015-12-09: qty 100

## 2015-12-09 MED ORDER — SENNOSIDES-DOCUSATE SODIUM 8.6-50 MG PO TABS: 1.0000 | ORAL_TABLET | Freq: Every evening | ORAL | Status: DC | PRN

## 2015-12-09 MED ORDER — ONDANSETRON HCL 4 MG/2ML IJ SOLN: 4.0000 mg | Freq: Three times a day (TID) | INTRAMUSCULAR | Status: DC | PRN

## 2015-12-09 MED ORDER — ACETAMINOPHEN 325 MG PO TABS
650.0000 mg | ORAL_TABLET | ORAL | Status: DC | PRN
  Administered 2015-12-11: 650 mg via ORAL
  Filled 2015-12-09: qty 2

## 2015-12-09 MED ORDER — ALTEPLASE (STROKE) FULL DOSE INFUSION
0.9000 mg/kg | Freq: Once | INTRAVENOUS | Status: AC
  Administered 2015-12-09: 58 mg via INTRAVENOUS
  Filled 2015-12-09: qty 100

## 2015-12-09 MED ORDER — ACETAMINOPHEN 650 MG RE SUPP: 650.0000 mg | RECTAL | Status: DC | PRN

## 2015-12-09 NOTE — ED Notes (Signed)
Delay in getting vitals started ,  Due to pt going straight to CT.

## 2015-12-09 NOTE — ED Provider Notes (Signed)
CSN: [id number]     Arrival date & time 12/09/15  1148 History  By signing my name below, I, Helane Gunther, attest that this documentation has been prepared under the direction and in the presence of Noemi Chapel, MD. Electronically Signed: Helane Gunther, ED Scribe. 12/09/2015. 12:52 PM.    Chief Complaint  Patient presents with  . Extremity Weakness   The history is provided by the patient. No language interpreter was used.   HPI Comments: [name] is a 82 y.o. female with a PMHx of HLD and essential HTN brought in by ambulance, who presents to the Emergency Department complaining of weakness of the left leg onset at 10:10 AM.  Pt states she was able to walk from her house to the church [place] [employer] without any difficulty, but found her left leg was growing weaker and that she had trouble stepping up into the [place]. She states that upon arrival at the church, she was trying to step down and off of the [place], but she was not able to do so. She also reports she was unable to walk at this point, even with the assistance of her walker. Pt denies SOB, CP, fever, and chills.   Past Medical History  Diagnosis Date  . Memory loss   . Leukopenia   . Hyponatremia   . Other and unspecified hyperlipidemia   . Proteinuria   . Edema   . Unspecified asthma, with exacerbation   . Arthropathy, unspecified, site unspecified   . Unspecified essential hypertension   . S/P colonoscopy Jan 2012    anal papilla, internal hemorrhoids, melanosis coli  . Hyperlipidemia    Past Surgical History  Procedure Laterality Date  . None    . Esophagogastroduodenoscopy  06/16/2011    Dr. Oneida Alar: moderate gastritis, no h.pylori  . Colonoscopy  11/05/2010    Dr. Gala Romney: Anal papilla and internal hemorrhoids pigmented rectal mucosa consistent with melanosis coli/ Diffuse pigmentation of the colonic mucosa consistent with melanosis coli, localized cecal diverticula as described above   Family History  Problem Relation Age  of Onset  . Colon cancer Neg Hx   . Heart disease    . Arthritis     Social History  Substance Use Topics  . Smoking status: Former Smoker    Types: Cigarettes  . Smokeless tobacco: Never Used  . Alcohol Use: No   OB History    Gravida Para Term Preterm AB TAB SAB Ectopic Multiple Living   0 0 0 0 0 0 0 0 0 0      Review of Systems  Constitutional: Negative for fever and chills.  Respiratory: Negative for shortness of breath.   Cardiovascular: Negative for chest pain.  Neurological: Positive for weakness.  All other systems reviewed and are negative.   Allergies  Review of patient's allergies indicates no known allergies.  Home Medications   Prior to Admission medications   Medication Sig Start Date End Date Taking? Authorizing Provider  acetaminophen (TYLENOL) 325 MG tablet Take 650 mg by mouth every 6 (six) hours as needed for mild pain.    Yes Historical Provider, MD  amLODipine (NORVASC) 5 MG tablet Take 5 mg by mouth daily.   Yes Cassandria Anger, MD  colesevelam [facility]) 625 MG tablet Take 1,875 mg by mouth 2 (two) times daily with a meal.   Yes Historical Provider, MD  furosemide (LASIX) 40 MG tablet Take 40 mg by mouth daily as needed for fluid. Take one tablet by mouth every  4 days as needed for fluid 04/15/11  Yes Cassandria Anger, MD  HYDROcodone-acetaminophen (NORCO/VICODIN) 5-325 MG tablet Take 1 tablet by mouth 2 (two) times daily. Take two every day per Niece   Yes Historical Provider, MD  IRON PO Take 325 mg by mouth 2 (two) times daily.    Yes Historical Provider, MD  omeprazole (PRILOSEC) 20 MG capsule Take 1 capsule (20 mg total) by mouth every morning. 30 minutes before breakfast. 07/05/15  Yes Danie Binder, MD  polyethylene glycol (MIRALAX / GLYCOLAX) packet Take 17 g by mouth daily as needed for mild constipation.    Yes Historical Provider, MD  ramipril (ALTACE) 10 MG capsule Take 10 mg by mouth daily. 04/15/11  Yes Cassandria Anger, MD   BP  144/76 mmHg  Pulse 61  Temp(Src) 97.8 F (36.6 C) (Oral)  Resp 20  Ht 5\' 5"  (1.651 m)  Wt 139 lb 12.4 oz (63.4 kg)  BMI 23.26 kg/m2  SpO2 100% Physical Exam  Constitutional: She is oriented to person, place, and time. She appears well-developed and well-nourished. No distress.  HENT:  Head: Normocephalic and atraumatic.  Mouth/Throat: Oropharynx is clear and moist. No oropharyngeal exudate.  Eyes: Conjunctivae and EOM are normal. Pupils are equal, round, and reactive to light. Right eye exhibits no discharge. Left eye exhibits no discharge. No scleral icterus.  Neck: Normal range of motion. Neck supple. No JVD present. No thyromegaly present.  Cardiovascular: Normal rate, regular rhythm, normal heart sounds and intact distal pulses.  Exam reveals no gallop and no friction rub.   No murmur heard. Pulmonary/Chest: Effort normal and breath sounds normal. No respiratory distress. She has no wheezes. She has no rales.  Abdominal: Soft. Bowel sounds are normal. She exhibits no distension and no mass. There is no tenderness.  Musculoskeletal: Normal range of motion. She exhibits no edema or tenderness.  Lymphadenopathy:    She has no cervical adenopathy.  Neurological: She is alert and oriented to person, place, and time. Coordination normal.  Slight LLE weakness to straight leg raise.  Normal finger-nose-finger, normal strength in the bilateral upper extremities, no pronator drift, clear speech, no facial droop, cranial nerves III through XII intact.  Unable to bear weight on the leg, no pain, just weakness  Skin: Skin is warm and dry. No rash noted. She is not diaphoretic. No erythema.  Psychiatric: She has a normal mood and affect. Her behavior is normal.  Nursing note and vitals reviewed.   ED Course  Procedures   COORDINATION OF CARE:  Code stroke was activated by Dr Roderic Palau, who was asked to see the pt in triage and immediately activated it.  12:18 PM - Discussed plans to wait  on diagnostic studies and imaging. Pt advised of plan for treatment and pt agrees.  12:34 PM - Thrombolytics recommended by specialist on call.   12:43 PM - Medicine being given, Dr Silverio Decamp accepted to Neuro ICU.   Labs Review Labs Reviewed  APTT - Abnormal; Notable for the following:    aPTT 21 (*)    All other components within normal limits  CBC - Abnormal; Notable for the following:    WBC 12.4 (*)    All other components within normal limits  DIFFERENTIAL - Abnormal; Notable for the following:    Neutro Abs 10.3 (*)    Monocytes Absolute 1.1 (*)    All other components within normal limits  COMPREHENSIVE METABOLIC PANEL - Abnormal; Notable for the following:  Glucose, Bld 129 (*)    BUN 36 (*)    Creatinine, Ser 1.25 (*)    Alkaline Phosphatase 193 (*)    GFR calc non Af Amer 36 (*)    GFR calc Af Amer 42 (*)    All other components within normal limits  URINE RAPID DRUG SCREEN, HOSP PERFORMED - Abnormal; Notable for the following:    Opiates POSITIVE (*)    All other components within normal limits  URINALYSIS, ROUTINE W REFLEX MICROSCOPIC (NOT AT [facility]) - Abnormal; Notable for the following:    APPearance HAZY (*)    Hgb urine dipstick MODERATE (*)    Protein, ur 100 (*)    Leukocytes, UA TRACE (*)    All other components within normal limits  URINE MICROSCOPIC-ADD ON - Abnormal; Notable for the following:    Squamous Epithelial / LPF 0-5 (*)    Bacteria, UA MANY (*)    All other components within normal limits  HEMOGLOBIN A1C - Abnormal; Notable for the following:    Hgb A1c MFr Bld 6.1 (*)    All other components within normal limits  GLUCOSE, CAPILLARY - Abnormal; Notable for the following:    Glucose-Capillary 162 (*)    All other components within normal limits  CBC - Abnormal; Notable for the following:    RBC 3.84 (*)    Hemoglobin 11.4 (*)    HCT 35.9 (*)    All other components within normal limits  BASIC METABOLIC PANEL - Abnormal; Notable for the  following:    Glucose, Bld 105 (*)    BUN 26 (*)    Creatinine, Ser 1.11 (*)    GFR calc non Af Amer 42 (*)    GFR calc Af Amer 48 (*)    All other components within normal limits  GLUCOSE, CAPILLARY - Abnormal; Notable for the following:    Glucose-Capillary 152 (*)    All other components within normal limits  GLUCOSE, CAPILLARY - Abnormal; Notable for the following:    Glucose-Capillary 127 (*)    All other components within normal limits  GLUCOSE, CAPILLARY - Abnormal; Notable for the following:    Glucose-Capillary 146 (*)    All other components within normal limits  I-STAT CHEM 8, ED - Abnormal; Notable for the following:    BUN 35 (*)    Creatinine, Ser 1.20 (*)    Glucose, Bld 124 (*)    Calcium, Ion 1.32 (*)    All other components within normal limits  CBG MONITORING, ED - Abnormal; Notable for the following:    Glucose-Capillary 125 (*)    All other components within normal limits  MRSA PCR SCREENING  ETHANOL  PROTIME-INR  LIPID PANEL  GLUCOSE, CAPILLARY  GLUCOSE, CAPILLARY  GLUCOSE, CAPILLARY  I-STAT TROPOININ, ED    Imaging Review Mr Brain Wo Contrast  12/10/2015  CLINICAL DATA:  Acute onset of left lower extremity weakness. Symptoms began at 10:10 yesterday. TPA was given. EXAM: MRI HEAD WITHOUT CONTRAST MRA HEAD WITHOUT CONTRAST TECHNIQUE: Multiplanar, multiecho pulse sequences of the brain and surrounding structures were obtained without intravenous contrast. Angiographic images of the head were obtained using MRA technique without contrast. COMPARISON:  CT head without contrast 12/09/2015. FINDINGS: MRI HEAD FINDINGS The diffusion-weighted images demonstrate no evidence for acute or subacute infarction. No acute hemorrhage or mass lesion is present. Mild to moderate generalized atrophy is present. There is moderate diffuse confluent periventricular and subcortical white matter disease bilaterally. The basal ganglia are  intact. Mild white matter changes extend  into the brainstem. The cerebellum is within normal limits. Flow is present in the major intracranial arteries. Bilateral lens replacements are present. The globes and orbits are otherwise intact. There is some fluid in the right mastoid air cells. The paranasal sinuses and mastoid air cells are otherwise clear. The skullbase is within normal limits. Midline sagittal images are unremarkable. MRA HEAD FINDINGS Moderate tortuosity is present within the cervical internal carotid arteries bilaterally. There is no focal stenosis through the ICA terminus. The right A1 segment is aplastic. Both A2 segments are opacified with a patent anterior communicating artery. The MCA bifurcations are intact. The ACA and MCA branch vessels are within normal limits. There is mild attenuation of distal small vessels. The right vertebral artery is slightly dominant left. The PICA origins are visualized and normal. The basilar artery is normal. Both posterior cerebral arteries originate from the basilar tip. The PCA branch vessels are within normal limits. IMPRESSION: 1. No evidence for acute or subacute infarction. 2. Mild to moderate generalized atrophy and moderate diffuse white matter disease. This likely reflects the sequela of chronic microvascular ischemia. 3. MRA circle of Willis without evidence for significant proximal stenosis, aneurysm, or branch vessel disease. Mild distal small vessel disease is present. Electronically Signed   By: San Morelle M.D.   On: 12/10/2015 14:15   Mr Jodene Nam Head/brain Wo Cm  12/10/2015  CLINICAL DATA:  Acute onset of left lower extremity weakness. Symptoms began at 10:10 yesterday. TPA was given. EXAM: MRI HEAD WITHOUT CONTRAST MRA HEAD WITHOUT CONTRAST TECHNIQUE: Multiplanar, multiecho pulse sequences of the brain and surrounding structures were obtained without intravenous contrast. Angiographic images of the head were obtained using MRA technique without contrast. COMPARISON:  CT head  without contrast 12/09/2015. FINDINGS: MRI HEAD FINDINGS The diffusion-weighted images demonstrate no evidence for acute or subacute infarction. No acute hemorrhage or mass lesion is present. Mild to moderate generalized atrophy is present. There is moderate diffuse confluent periventricular and subcortical white matter disease bilaterally. The basal ganglia are intact. Mild white matter changes extend into the brainstem. The cerebellum is within normal limits. Flow is present in the major intracranial arteries. Bilateral lens replacements are present. The globes and orbits are otherwise intact. There is some fluid in the right mastoid air cells. The paranasal sinuses and mastoid air cells are otherwise clear. The skullbase is within normal limits. Midline sagittal images are unremarkable. MRA HEAD FINDINGS Moderate tortuosity is present within the cervical internal carotid arteries bilaterally. There is no focal stenosis through the ICA terminus. The right A1 segment is aplastic. Both A2 segments are opacified with a patent anterior communicating artery. The MCA bifurcations are intact. The ACA and MCA branch vessels are within normal limits. There is mild attenuation of distal small vessels. The right vertebral artery is slightly dominant left. The PICA origins are visualized and normal. The basilar artery is normal. Both posterior cerebral arteries originate from the basilar tip. The PCA branch vessels are within normal limits. IMPRESSION: 1. No evidence for acute or subacute infarction. 2. Mild to moderate generalized atrophy and moderate diffuse white matter disease. This likely reflects the sequela of chronic microvascular ischemia. 3. MRA circle of Willis without evidence for significant proximal stenosis, aneurysm, or branch vessel disease. Mild distal small vessel disease is present. Electronically Signed   By: San Morelle M.D.   On: 12/10/2015 14:15   I have personally reviewed and evaluated  these images and lab results  as part of my medical decision-making.  ED ECG REPORT  I personally interpreted this EKG   Date: 12/11/2015   Rate: 84  Rhythm: normal sinus rhythm  QRS Axis: normal  Intervals: normal  ST/T Wave abnormalities: normal  Conduction Disutrbances:none  Narrative Interpretation:   Old EKG Reviewed: none available   MDM   Final diagnoses:  Acute ischemic stroke ([place])  I personally performed the services described in this documentation, which was scribed in my presence. The recorded information has been reviewed and is accurate.   The patient appears acutely ill with an acute ischemic stroke., Her vital signs show mild hypertension and her exam is persistent, she is unable to bear any weight on that leg. Specialist on call was consulted immediately, thrombolytics were administered in less than 60 minutes door to meds  CRITICAL CARE Performed by: Noemi Chapel, MD Total critical care time: 35 minutes Critical care time was exclusive of separately billable procedures and treating other patients. Critical care was necessary to treat or prevent imminent or life-threatening deterioration. Critical care was time spent personally by me on the following activities: development of treatment plan with patient and/or surrogate as well as nursing, discussions with consultants, evaluation of patient's response to treatment, examination of patient, obtaining history from patient or surrogate, ordering and performing treatments and interventions, ordering and review of laboratory studies, ordering and review of radiographic studies, pulse oximetry and re-evaluation of patient's condition.    Noemi Chapel, MD 12/11/15 1218

## 2015-12-09 NOTE — H&P (Signed)
Requesting Physician: Dr. Sabra Heck , in the [facility] ER    Reason for   Transfer/admission:  Acute stroke, post-TPA in the [facility] emergency room  HPI:                                                                                                                                         IDELIA BOYZO is an 82 y.o. female patient who  Was taken to the [facility] emergency room via EMS for evaluation of acute onset of leg weakness on the left. Patient unable to provide history clearly.  Per discussion with Dr. Sabra Heck who called for transfer the patient to [facility],  Patient developed acute left leg weakness this morning at 10:10 AM.  She continued to have progressive weakness in left lower extremity when she went to the church. She was evaluated in the [facility] and was noted to be a candidate for IV TPA given her acute onset of left leg weakness. After completing IV TPA infusion, as he does not have a neurologic coverage over this weekend, Dr. Sabra Heck called for transfer the patient to [facility] for continued  Post TPA neurological monitoring.   She has been transferred directly to the neuro ICU at the [facility].  She reports resolution of the left leg weakness at this time. She denied any other new neurological symptoms.   Date last known well:  12/09/15 Time last known well:  10:10 am tPA Given: Yes  Stroke Risk Factors - hypertension  Past Medical History: Past Medical History  Diagnosis Date  . Memory loss   . Leukopenia   . Hyponatremia   . Other and unspecified hyperlipidemia   . Proteinuria   . Edema   . Unspecified asthma, with exacerbation   . Arthropathy, unspecified, site unspecified   . Unspecified essential hypertension   . S/P colonoscopy Jan 2012    anal papilla, internal hemorrhoids, melanosis coli  . Hyperlipidemia     Past Surgical History  Procedure Laterality Date  . None    . Esophagogastroduodenoscopy  06/16/2011    Dr. Oneida Alar: moderate  gastritis, no h.pylori  . Colonoscopy  11/05/2010    Dr. Gala Romney: Anal papilla and internal hemorrhoids pigmented rectal mucosa consistent with melanosis coli/ Diffuse pigmentation of the colonic mucosa consistent with melanosis coli, localized cecal diverticula as described above    Family History: Family History  Problem Relation Age of Onset  . Colon cancer Neg Hx   . Heart disease    . Arthritis      Social History:   reports that she has quit smoking. Her smoking use included Cigarettes. She has never used smokeless tobacco. She reports that she does not drink alcohol or use illicit drugs.  Allergies:  No Known Allergies   Medications:  Current facility-administered medications:  .   stroke: mapping our early stages of recovery book, , Does not apply, Once, Matheus Spiker Fuller Mandril, MD .  0.9 %  sodium chloride infusion, , Intravenous, Continuous, Kalyna Paolella Fuller Mandril, MD .  acetaminophen (TYLENOL) tablet 650 mg, 650 mg, Oral, Q4H PRN **OR** acetaminophen (TYLENOL) suppository 650 mg, 650 mg, Rectal, Q4H PRN, Aylinn Rydberg Fuller Mandril, MD .  labetalol (NORMODYNE,TRANDATE) injection 10 mg, 10 mg, Intravenous, Q10 min PRN, Allyah Heather Fuller Mandril, MD .  ondansetron [facility]) injection 4 mg, 4 mg, Intravenous, Q8H PRN, Noemi Chapel, MD .  pantoprazole (PROTONIX) injection 40 mg, 40 mg, Intravenous, QHS, Arron Mcnaught Fuller Mandril, MD .  senna-docusate (Senokot-S) tablet 1 tablet, 1 tablet, Oral, QHS PRN, Chukwudi Ewen Fuller Mandril, MD   ROS:                                                                                                                                       History  unobtainable from patient due to age   Neurologic Examination:                                                                                                       Blood pressure 158/87, pulse 70, temperature 97.6 F (36.4 C), temperature source Oral, resp. rate 17, height 5\' 5"  (1.651 m), weight 63.4 kg (139 lb 12.4 oz), SpO2 100 %.  Evaluation of higher integrative functions including: Level of alertness: Alert,  Oriented to time, place and person Speech: fluent, no evidence of dysarthria or aphasia noted.  Test the following cranial nerves: 2-12 grossly intact Motor examination: Normal tone, bulk, full 5/5 motor strength in all 4 extremities,  Except for moderate weakness of the right ankle dorsiflexion with atrophy of the right foot muscles,  Likely chronic right foot drop.  No motor weakness in the left lower extent he noted.  Examination of sensation :  Reports symmetric sensation to pinprick in all 4 extremities and on face Examination of deep tendon reflexes: 1+,symmetric in all extremities, normal plantars bilaterally Test coordination: Normal finger nose testing, with no evidence of limb appendicular ataxia or abnormal involuntary movements or tremors noted.     Lab Results: Basic Metabolic Panel:  Recent Labs Lab 12/09/15 1220 12/09/15 1239  NA 140 143  K 3.9 3.8  CL 109 108  CO2 22  --   GLUCOSE 129* 124*  BUN 36* 35*  CREATININE 1.25* 1.20*  CALCIUM 9.6  --  Liver Function Tests:  Recent Labs Lab 12/09/15 1220  AST 38  ALT 24  ALKPHOS 193*  BILITOT 0.6  PROT 7.8  ALBUMIN 3.8   No results for input(s): LIPASE, AMYLASE in the last 168 hours. No results for input(s): AMMONIA in the last 168 hours.  CBC:  Recent Labs Lab 12/09/15 1220 12/09/15 1239  WBC 12.4*  --   NEUTROABS 10.3*  --   HGB 13.2 15.0  HCT 39.3 44.0  MCV 93.3  --   PLT 199  --     Cardiac Enzymes: No results for input(s): CKTOTAL, CKMB, CKMBINDEX, TROPONINI in the last 168 hours.  Lipid Panel: No results for input(s): CHOL, TRIG, HDL, CHOLHDL, VLDL, LDLCALC in the last 168 hours.  CBG:  Recent Labs Lab 12/09/15 1219  GLUCAP  125*    Microbiology: Results for orders placed or performed during the hospital encounter of 04/08/11  Urine culture     Status: None   Collection Time: 04/08/11  2:09 PM  Result Value Ref Range Status   Specimen Description URINE, CATHETERIZED  Final   Special Requests NONE  Final   Culture  Setup Time OF:[id number]  Final   Colony Count >=100,000 COLONIES/ML  Final   Culture ESCHERICHIA COLI  Final   Report Status 04/12/2011 FINAL  Final   Organism ID, Bacteria ESCHERICHIA COLI  Final      Susceptibility   Escherichia coli - MIC*    AMPICILLIN 4 Sensitive     CEFAZOLIN <=4 Sensitive     CEFTRIAXONE <=1 Sensitive     CIPROFLOXACIN <=0.25 Sensitive     GENTAMICIN <=1 Sensitive     LEVOFLOXACIN <=0.12 Sensitive     NITROFURANTOIN <=16 Sensitive     TOBRAMYCIN <=1 Sensitive     TRIMETH/SULFA <=20 Sensitive     * ESCHERICHIA COLI  Urine culture     Status: None   Collection Time: 04/11/11  8:03 AM  Result Value Ref Range Status   Specimen Description URINE, CLEAN CATCH  Final   Special Requests IMMUNE:NORM UT SYMPT:NEG DRAWN BY RN  Final   Culture  Setup Time [id number]  Final   Colony Count 65,000 COLONIES/ML  Final   Culture   Final    Multiple bacterial morphotypes present, none predominant. Suggest appropriate recollection if clinically indicated.   Report Status 04/13/2011 FINAL  Final  Culture, blood (routine single)     Status: None   Collection Time: 04/11/11  8:30 AM  Result Value Ref Range Status   Specimen Description BLOOD LEFT ARM  Final   Special Requests BOTTLES DRAWN AEROBIC AND ANAEROBIC 6CC  Final   Culture NO GROWTH 5 DAYS  Final   Report Status 04/16/2011 FINAL  Final     Imaging: Ct Head Wo Contrast  12/09/2015  CLINICAL DATA:  Sudden onset left-sided weakness at 10:15 a.m. today, patient unable to scanned currently EXAM: CT HEAD WITHOUT CONTRAST TECHNIQUE: Contiguous axial images were obtained from the base of the skull through the vertex without  intravenous contrast. COMPARISON:  12/02/2007 FINDINGS: Diffuse age-related cortical atrophy and low attenuation in the deep white matter. No evidence of mass or vascular territory infarct. No evidence of parenchymal hemorrhage or extra-axial fluid. Ventricles are prominent, likely related to atrophy. Proportion elevated is difficult to determine. The ventricles do show interval increase in prominence when compared to the prior study. The calvarium is intact. The visualized portions of the paranasal sinuses are clear. IMPRESSION: No acute intracranial findings. Diffuse age-related  involutional change. Interval increase in ventricular prominence since 2009, likely reflecting progressive dilatation related to progressive cerebral atrophy, although proportionality is difficult to determine definitively. Electronically Signed   By: Skipper Cliche M.D.   On: 12/09/2015 12:18    Assessment and plan:   LASTAR FEDELI is an 82 y.o. female patient who is transfer to the [facility] neuro ICU from the [facility] the emergency room for post-TPA care.  As described above, she presented to the [facility] ER with acute onset of left leg weakness and was given IV TPA for acute stroke. At this time patient he is a symptomatic with resolution of the left leg weakness. Her NIH stroke scale is 0 at this time. She does appear to have a chronic right foot drop on exam,  which patient is unaware of. Her blood pressure, heart rate been stable.   For further stroke workup, ordered brain MRI, MRA of the head and neck , echocardiogram,  Monitored on cardiac telemetry,  speech pathology evaluation, physical and occupational therapies.   Check A1c, lipid profile.  SCDs for DVT prophylaxis.    No family members available at bedside.     Stroke team will continue to follow starting tomorrow morning.

## 2015-12-09 NOTE — ED Notes (Signed)
Bleeding in pt mouth reassessed, bleeding appears to be decreased. Tea bag still in place.

## 2015-12-09 NOTE — ED Notes (Signed)
Patient gettng on church Lucianne Lei when she states left leg gave out. Patient reports weakness in left leg. No other neurological deficits. Patient able to move leg. Dr Roderic Palau called to triage, assessing patient. Per niece has problems with left knee "bone-bone"-patient denies any pain. Patient uses walker at home.

## 2015-12-09 NOTE — ED Notes (Signed)
Small amount of bleeding from mouth.  Examined mouth and blood is coming from  back right lower gum.

## 2015-12-09 NOTE — ED Notes (Signed)
Tea bag placed to pt R lower back gum per Dr. Sabra Heck. Bleeding amount not increasing at this time.

## 2015-12-09 NOTE — ED Notes (Signed)
Called [facility] for neuro hospitalist at [facility]

## 2015-12-09 NOTE — Progress Notes (Signed)
CODE STROKE. 11:55 PHONE CALL 1200  Beeper - and exam begun. 12:10 [facility] called spoke with Kenney Houseman

## 2015-12-09 NOTE — ED Notes (Signed)
Paged code stroke at 12:02; called [place] at 12:04

## 2015-12-09 NOTE — ED Notes (Signed)
Bleeding in pt mouth reassessed. No change noted, tea bag still in place.

## 2015-12-10 ENCOUNTER — Inpatient Hospital Stay (HOSPITAL_COMMUNITY): Payer: Medicare Other

## 2015-12-10 LAB — GLUCOSE, CAPILLARY
GLUCOSE-CAPILLARY: 90 mg/dL (ref 65–99)
GLUCOSE-CAPILLARY: 152 mg/dL — AB (ref 65–99)
Glucose-Capillary: 93 mg/dL (ref 65–99)
Glucose-Capillary: 127 mg/dL — ABNORMAL HIGH (ref 65–99)

## 2015-12-10 LAB — LIPID PANEL
CHOL/HDL RATIO: 3 ratio
CHOLESTEROL: 175 mg/dL (ref 0–200)
HDL: 59 mg/dL (ref >40)
LDL Cholesterol: 95 mg/dL (ref 0–99)
TRIGLYCERIDES: 107 mg/dL (ref <150)
VLDL: 21 mg/dL (ref 0–40)

## 2015-12-10 MED ORDER — ATORVASTATIN CALCIUM 10 MG PO TABS
10.0000 mg | ORAL_TABLET | Freq: Every day | ORAL | Status: DC
  Administered 2015-12-10 – 2015-12-11 (×2): 10 mg via ORAL
  Filled 2015-12-10 (×2): qty 1

## 2015-12-10 MED ORDER — ASPIRIN EC 81 MG PO TBEC
81.0000 mg | DELAYED_RELEASE_TABLET | Freq: Every day | ORAL | Status: DC
  Administered 2015-12-10 – 2015-12-12 (×3): 81 mg via ORAL
  Filled 2015-12-10 (×3): qty 1

## 2015-12-10 NOTE — Evaluation (Signed)
Speech Language Pathology Evaluation Patient Details Name: [name] MRN: [id number] DOB: 1922-07-24 Today's Date: 12/10/2015 Time: [id number] SLP Time Calculation (min) (ACUTE ONLY): 11 min  Problem List:  Patient Active Problem List   Diagnosis Date Noted  . Acute ischemic stroke ([place]) 12/09/2015  . Stroke (cerebrum) ([place]) 12/09/2015  . OA (osteoarthritis) of knee 09/18/2011  . Alkaline phosphatase elevation 05/19/2011  . Anemia 05/19/2011  . Rhabdomyolysis 04/12/2011  . E-coli UTI 04/12/2011  . Headache(784.0) 04/12/2011  . SPONDYLOSIS 04/03/2010  . BACK PAIN 04/03/2010  . ULCER OF LOWER LIMB, UNSPECIFIED 11/08/2008  . OSTEOARTHRITIS, KNEES, BILATERAL 02/01/2008  . OSTEOPOROSIS 12/28/2007  . ALLERGIC RHINITIS, SEASONAL 12/14/2007  . MEMORY LOSS 11/26/2007  . HYPERLIPIDEMIA 07/12/2007  . HYPERTENSION 06/14/2007  . ASTHMA NOS W/ACUTE EXACERBATION 06/14/2007  . ARTHRITIS 06/14/2007  . PROTEINURIA 06/14/2007   Past Medical History:  Past Medical History  Diagnosis Date  . Memory loss   . Leukopenia   . Hyponatremia   . Other and unspecified hyperlipidemia   . Proteinuria   . Edema   . Unspecified asthma, with exacerbation   . Arthropathy, unspecified, site unspecified   . Unspecified essential hypertension   . S/P colonoscopy Jan 2012    anal papilla, internal hemorrhoids, melanosis coli  . Hyperlipidemia    Past Surgical History:  Past Surgical History  Procedure Laterality Date  . None    . Esophagogastroduodenoscopy  06/16/2011    Dr. Oneida Alar: moderate gastritis, no h.pylori  . Colonoscopy  11/05/2010    Dr. Gala Romney: Anal papilla and internal hemorrhoids pigmented rectal mucosa consistent with melanosis coli/ Diffuse pigmentation of the colonic mucosa consistent with melanosis coli, localized cecal diverticula as described above   HPI:  82 y.o. female patient who was taken to the [facility] emergency room via EMS for evaluation of acute onset of leg weakness  on the left. Transferred to [facility]; IV PTA administered.     Assessment / Plan / Recommendation Clinical Impression  Pt presents with functional communication/speech/language without acute changes.  Pt oriented x3, demonstrates fluent, clear speech; verbal problem-solving, attention, and basic recall (mild deficits baseline) are functional.  No SLP needs are identified .      SLP Assessment  Patient does not need any further Speech Lanaguage Pathology Services    Follow Up Recommendations  None    Frequency and Duration           SLP Evaluation Prior Functioning  Cognitive/Linguistic Baseline: Information not available   Cognition  Overall Cognitive Status: Difficult to assess; mild memory deficits baseline Arousal/Alertness: Awake/alert Orientation Level: Oriented X4 Attention: Sustained Sustained Attention: Appears intact Memory:  (mild deficits for word recall (2/3); improved with repetition) Awareness: Appears intact    Comprehension  Auditory Comprehension Overall Auditory Comprehension: Appears within functional limits for tasks assessed Visual Recognition/Discrimination Discrimination: Within Function Limits Reading Comprehension Reading Status: Not tested    Expression Expression Primary Mode of Expression: Verbal Verbal Expression Overall Verbal Expression: Appears within functional limits for tasks assessed Written Expression Dominant Hand: Right   Oral / Motor  Oral Motor/Sensory Function Overall Oral Motor/Sensory Function: Within functional limits Motor Speech Overall Motor Speech: Appears within functional limits for tasks assessed   GO                    Juan Quam Laurice 12/10/2015, 11:22 AM

## 2015-12-10 NOTE — Progress Notes (Signed)
PT Cancellation Note  Patient Details Name: [name] MRN: [id number] DOB: December 23, 1921   Cancelled Treatment:    Reason Eval/Treat Not Completed: Patient at procedure or test/unavailable (Pt off floor for MRI).  PT will continue to follow acutely and will attempt to see pt again this afternoon, time permitting.  Collie Siad PT, DPT  Pager: [phone] Phone: [phone] 12/10/2015, 1:39 PM

## 2015-12-10 NOTE — Evaluation (Signed)
Occupational Therapy Evaluation Patient Details Name: [name] MRN: [id number] DOB: 1922-09-01 Today's Date: 12/10/2015    History of Present Illness Pt is a 82 yo female admitted from [facility] after receiving TPA.  Pt went to church Sunday am and felt weak in her LLE.  Pt in work up for CVA vs TIA. Pt had issue with R leg over a year ago and appears to have some residual weakness in the RLE from that episode.     Clinical Impression   Pt admitted with the above diagnosis and has the deficits listed below. Pt would benefit from cont OT to increase independence with basic adls back to mod I so she can return back home with niece at mod I level of care and work at the [employer].  Pt will need skilled rehab before returning home as 24/7 S is not available.  Niece with with pt at home and at family business most of day but pt is alone at times. Pt not safe to be alone at this time.      Follow Up Recommendations  SNF;Supervision/Assistance - 24 hour    Equipment Recommendations  None recommended by OT    Recommendations for Other Services       Precautions / Restrictions Precautions Precautions: Fall Precaution Comments: Pt states she has not fallen since her last fall over a year ago. Restrictions Weight Bearing Restrictions: No      Mobility Bed Mobility Overal bed mobility: Needs Assistance Bed Mobility: Supine to Sit     Supine to sit: Min assist     General bed mobility comments: Pt needed assist getting to fill sitting with hips square.  Bed was flat.  Transfers Overall transfer level: Needs assistance Equipment used: 1 person hand held assist Transfers: Sit to/from [facility] Sit to Stand: Mod assist Stand pivot transfers: Mod assist       General transfer comment: Pt with posterior lean.  Pt did do better when asked to tuck bottom under.    Balance Overall balance assessment: Needs assistance Sitting-balance support: Feet  supported Sitting balance-Leahy Scale: Fair     Standing balance support: Bilateral upper extremity supported;During functional activity Standing balance-Leahy Scale: Poor Standing balance comment: Pt required significant outside assist to remain standing.                            ADL Overall ADL's : Needs assistance/impaired Eating/Feeding: Set up;Sitting   Grooming: Oral care;Wash/dry face;Wash/dry hands;Minimal assistance;Sitting Grooming Details (indicate cue type and reason): Pt set up for grooming is different than at home therefore needing assist with set up. Upper Body Bathing: Set up;Sitting   Lower Body Bathing: Moderate assistance;Sit to/from stand Lower Body Bathing Details (indicate cue type and reason): Pt not steady on her feet when standing and needs assist wehn standing to wash bottom. Upper Body Dressing : Minimal assistance;Sitting   Lower Body Dressing: Moderate assistance;Sit to/from stand Lower Body Dressing Details (indicate cue type and reason): Pt needs assist when on her feet to fasten pants etc. Pt cannot let go of walker to manage clothes or pt will fall. Toilet Transfer: Stand-pivot;Moderate assistance;BSC Toilet Transfer Details (indicate cue type and reason): pt with a posterior lean when pivoting. Toileting- Clothing Manipulation and Hygiene: Moderate assistance;Sit to/from stand (pt states she uses depends at night.) Toileting - Clothing Manipulation Details (indicate cue type and reason): pt requires assist for balance while cleaning self  in standing.     Functional mobility during ADLs: Moderate assistance General ADL Comments: Pt requires most assist when on her feet. Pt not safe to be standing alone.  Pt needs outside assist and is a fall risk. Family states pt is cognitively intact but did appear to have some mild attention deficits and STM deficits on exam.     Vision Vision Assessment?: No apparent visual deficits   Perception  Perception Perception Tested?: Yes   Praxis Praxis Praxis tested?: Within functional limits    Pertinent Vitals/Pain Pain Assessment: No/denies pain     Hand Dominance Right   Extremity/Trunk Assessment Upper Extremity Assessment Upper Extremity Assessment: Overall WFL for tasks assessed   Lower Extremity Assessment Lower Extremity Assessment: Defer to PT evaluation   Cervical / Trunk Assessment Cervical / Trunk Assessment: Normal   Communication Communication Communication: Other (comment) (Pt did seem to have difficulty getting her words out.)   Cognition     Overall Cognitive Status: Difficult to assess       Memory: Decreased recall of precautions;Decreased short-term memory             General Comments       Exercises       Shoulder Instructions      Home Living Family/patient expects to be discharged to:: Skilled nursing facility Living Arrangements: Other relatives                               Additional Comments: Pt lives with neice.  Pt still has her home but does not stay there.  Family working to get this out of her name.        Prior Functioning/Environment Level of Independence: Needs assistance  Gait / Transfers Assistance Needed: uses a walker at all times and has for last year.   ADL's / Homemaking Assistance Needed: Pt has aid that comes one time a week to assist her into her shower.  Otherwise, pt is independent with basic adls with equipment.   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      OT Diagnosis: Generalized weakness;Cognitive deficits   OT Problem List: Decreased strength;Decreased activity tolerance;Impaired balance (sitting and/or standing);Decreased cognition;Decreased safety awareness;Decreased knowledge of use of DME or AE   OT Treatment/Interventions: Self-care/ADL training;DME and/or AE instruction;Therapeutic activities    OT  Goals(Current goals can be found in the care plan section) Acute Rehab OT Goals Patient Stated Goal: to go to SNF I went to before. OT Goal Formulation: With patient Time For Goal Achievement: 12/24/15 Potential to Achieve Goals: Good ADL Goals Pt Will Perform Grooming: with supervision;standing Pt Will Perform Lower Body Bathing: with min guard assist;sit to/from stand Pt Will Perform Lower Body Dressing: with min guard assist;sit to/from stand Additional ADL Goal #1: Pt will transefer to [facility] and toilet with min guard.  OT Frequency: Min 2X/week   Barriers to D/C: Decreased caregiver support  Pt lives with neice but she is not available 24/7.        Co-evaluation              End of Session Nurse Communication: Mobility status;Other (comment) (need for chair alarm when one available.)  Activity Tolerance: Patient tolerated treatment well Patient left: in chair;with call bell/phone within reach;Other (comment) (no chair alarm pads on unit.  Nursing aware.)   Time: [id number] OT  Time Calculation (min): 28 min Charges:  OT General Charges $OT Visit: 1 Procedure OT Evaluation $OT Eval Moderate Complexity: 1 Procedure OT Treatments $Self Care/Home Management : 8-22 mins G-Codes:    Glenford Peers Dec 11, 2015, 11:42 AM  [phone]

## 2015-12-10 NOTE — Progress Notes (Signed)
STROKE TEAM PROGRESS NOTE   HISTORY OF PRESENT ILLNESS DELAYSIA PAUP is an 82 y.o. female patient who was taken to the [facility] emergency room via EMS for evaluation of acute onset of leg weakness on the left. Patient unable to provide history clearly. Per discussion with Dr. Sabra Heck who called for transfer the patient to [facility], Patient developed acute left leg weakness this morning 12/09/2015 at 10:10 AM (LKW). She continued to have progressive weakness in left lower extremity when she went to the church. She was evaluated in the [place] and was noted to be a candidate for IV TPA given her acute onset of left leg weakness. After completing IV TPA infusion, as he does not have a neurologic coverage over this weekend, Dr. Sabra Heck called for transfer the patient to [facility] for continued Post TPA neurological monitoring. She has been transferred directly to the neuro ICU at the [facility]. She reports resolution of the left leg weakness at this time. She denied any other new neurological symptoms.    SUBJECTIVE (INTERVAL HISTORY) No family is at the bedside.  Overall she feels her condition is stable. She is sitting up in the bed eating her breakfast.    OBJECTIVE Temp:  [97.6 F (36.4 C)-98.5 F (36.9 C)] 98.1 F (36.7 C) (03/06 0800) Pulse Rate:  [52-96] 57 (03/06 0700) Cardiac Rhythm:  [-] Normal sinus rhythm (03/05 2000) Resp:  [11-30] 20 (03/06 0700) BP: (99-188)/(56-145) 143/82 mmHg (03/06 0700) SpO2:  [20 %-100 %] 100 % (03/06 0700) Weight:  [63.4 kg (139 lb 12.4 oz)-64.411 kg (142 lb)] 63.4 kg (139 lb 12.4 oz) (03/05 1615)  CBC:   Recent Labs Lab 12/09/15 1220 12/09/15 1239  WBC 12.4*  --   NEUTROABS 10.3*  --   HGB 13.2 15.0  HCT 39.3 44.0  MCV 93.3  --   PLT 199  --     Basic Metabolic Panel:   Recent Labs Lab 12/09/15 1220 12/09/15 1239  NA 140 143  K 3.9 3.8  CL 109 108  CO2 22  --   GLUCOSE 129* 124*  BUN 36* 35*  CREATININE  1.25* 1.20*  CALCIUM 9.6  --     Lipid Panel:     Component Value Date/Time   CHOL 175 12/10/2015 0537   TRIG 107 12/10/2015 0537   HDL 59 12/10/2015 0537   CHOLHDL 3.0 12/10/2015 0537   VLDL 21 12/10/2015 0537   LDLCALC 95 12/10/2015 0537   HgbA1c: No results found for: HGBA1C Urine Drug Screen:     Component Value Date/Time   LABOPIA POSITIVE* 12/09/2015 1321   COCAINSCRNUR NONE DETECTED 12/09/2015 1321   LABBENZ NONE DETECTED 12/09/2015 1321   AMPHETMU NONE DETECTED 12/09/2015 1321   THCU NONE DETECTED 12/09/2015 1321   LABBARB NONE DETECTED 12/09/2015 1321      IMAGING  Ct Head Wo Contrast  12/09/2015  CLINICAL DATA:  Sudden onset left-sided weakness at 10:15 a.m. today, patient unable to scanned currently EXAM: CT HEAD WITHOUT CONTRAST TECHNIQUE: Contiguous axial images were obtained from the base of the skull through the vertex without intravenous contrast. COMPARISON:  12/02/2007 FINDINGS: Diffuse age-related cortical atrophy and low attenuation in the deep white matter. No evidence of mass or vascular territory infarct. No evidence of parenchymal hemorrhage or extra-axial fluid. Ventricles are prominent, likely related to atrophy. Proportion elevated is difficult to determine. The ventricles do show interval increase in prominence when compared to the prior study. The calvarium  is intact. The visualized portions of the paranasal sinuses are clear. IMPRESSION: No acute intracranial findings. Diffuse age-related involutional change. Interval increase in ventricular prominence since 2009, likely reflecting progressive dilatation related to progressive cerebral atrophy, although proportionality is difficult to determine definitively. Electronically Signed   By: Skipper Cliche M.D.   On: 12/09/2015 12:18       PHYSICAL EXAM Pleasant elderly lady currently not in distress. . Afebrile. Head is nontraumatic. Neck is supple without bruit.    Cardiac exam no murmur or gallop. Lungs  are clear to auscultation. Distal pulses are well felt. Neurological Exam :  Awake alert oriented 3 normal speech and language function. No aphasia or apraxia dysarthria. Pupils irregular but reactive. Vision acuity and fields seem adequate. Fundi were not visualized. Face is symmetric without weakness. Tongue is midline. Motor system exam reveals no upper or lower extremity drift. Right foot drop with ankle dorsiflexor weakness which is chronic. Sensation is intact bilaterally. Coordination is slow but accurate. Gait was not tested. ASSESSMENT/PLAN Ms. FOTINI BARTOL is a 82 y.o. female with history of hypertension, hyperlipidemia and memory loss  presenting with left leg weakness.Transferred from an AP and status post IV t-PA 12/09/2015 at 1244.   Possible R brain stroke vs TIA s/pIV tPA. Workup underway  Right lower back gum bleeding s/p IV tPA. Asymptomatic. No further bleeding after transfer to [facility]  Resultant  L leg weakness improving  MRI  pending   MRA  pending   Cancel MRA neck and replace with Carotid Doppler  2D Echo  pending   LDL 95  HgbA1c pending  SCDs for VTE prophylaxis Diet regular Room service appropriate?: Yes; Fluid consistency:: Thin  No antithrombotic prior to admission, now on No antithrombotic as within 24h of tPA administration. Add low-dose aspirin if 24-hour imaging negative for hemorrhage.  Ongoing aggressive stroke risk factor management  Therapy recommendations:  ok to be OOB, therapy evals pending  Disposition:  pending    Transfer to floor later today  Hypertension  Stable  Hyperlipidemia  Home meds:  welchol bid  LDL 95, goal < 70  Add statin - Lipitor 10 mg daily  Continue statin at discharge  Other Stroke Risk Factors  Advanced age  Cigarette smoker, advised to stop smoking  Other Active Problems  Baseline memory deficits  CKD, Cr 1.2  Leukocytosis, white blood cells 12.4, recheck in am  (UA neg nitrates, trace  leuko)  Hospital day # [place] for Pager information 12/10/2015 3:47 PM  I have personally examined this patient, reviewed notes, independently viewed imaging studies, participated in medical decision making and plan of care. I have made any additions or clarifications directly to the above note. Agree with note above.  She presented left leg weakness and was treated with IV TPA with significant neurological improvement. She remains at risk for neurological worsening, recurrent stroke, post TPA hemorrhage and needs close neurological monitoring and aggressive blood pressure and risk factor control. I had a long discussion with the patient and family at the bedside and answered questions. This patient is critically ill and at significant risk of neurological worsening, death and care requires constant monitoring of vital signs, hemodynamics,respiratory and cardiac monitoring, extensive review of multiple databases, frequent neurological assessment, discussion with family, other specialists and medical decision making of high complexity.I have made any additions or clarifications directly to the above note.This critical care time does not reflect procedure time, or  teaching time or supervisory time of PA/NP/Med Resident etc but could involve care discussion time.  I spent 30 minutes of neurocritical care time  in the care of  this patient.   Antony Contras, MD Medical Director [facility] Pager: [phone] 12/10/2015 6:10 PM    To contact Stroke Continuity provider, please refer to [email or web address] After hours, contact General Neurology

## 2015-12-10 NOTE — Evaluation (Signed)
Physical Therapy Evaluation Patient Details Name: [name] MRN: [id number] DOB: 03/09/1922 Today's Date: 12/10/2015   History of Present Illness  Pt is a 82 yo female admitted from [facility] after receiving TPA.  Pt went to church Sunday am and felt weak in her LLE.  Pt in work up for CVA vs TIA. Pt had issue with R leg over a year ago and appears to have some residual weakness in the RLE from that episode.    Clinical Impression  Pt admitted with above diagnosis. Pt currently with functional limitations due to the deficits listed below (see PT Problem List). Pt presents w/ Rt LE weakness (likely due to h/o Rt knee pain), and impaired cognition and balance.  She currently requires +2 mod assist for sit<>stand transfers and ambulation. Pt will benefit from skilled PT to increase their independence and safety with mobility to allow discharge to the venue listed below.      Follow Up Recommendations SNF;Supervision/Assistance - 24 hour    Equipment Recommendations  Wheelchair (measurements PT);Wheelchair cushion (measurements PT)    Recommendations for Other Services       Precautions / Restrictions Precautions Precautions: Fall Precaution Comments: Pt states she has not fallen since her last fall over a year ago. Restrictions Weight Bearing Restrictions: No      Mobility  Bed Mobility Overal bed mobility: Needs Assistance Bed Mobility: Supine to Sit     Supine to sit: Min assist     General bed mobility comments: Increased time and use of bed rail.  Min assist to use bed pad to assist pt scooting to EOB.  Transfers Overall transfer level: Needs assistance Equipment used: 2 person hand held assist Transfers: Sit to/from [facility] Sit to Stand: Mod assist;+2 physical assistance;+2 safety/equipment Stand pivot transfers: Mod assist;+2 physical assistance;+2 safety/equipment       General transfer comment: Cues for hand placement.  Mod +2 assist to  boost up to standing and for controlled descent to chair and commode.  Directional and sequencing cues for side stepping from commode to chair.  Ambulation/Gait Ambulation/Gait assistance: Mod assist;+2 physical assistance;+2 safety/equipment Ambulation Distance (Feet): 15 Feet Assistive device: 2 person hand held assist Gait Pattern/deviations: Decreased stride length;Trunk flexed;Staggering right;Staggering left   Gait velocity interpretation: <1.8 ft/sec, indicative of risk for recurrent falls General Gait Details: HR up to 151.  Pt WB heavily through HHA Bil.  Forward trunk lean w/ partial buckle in Rt knee noted ambulating around bed in room.    Stairs            Wheelchair Mobility    Modified Rankin (Stroke Patients Only) Modified Rankin (Stroke Patients Only) Pre-Morbid Rankin Score: Moderate disability Modified Rankin: Moderately severe disability     Balance Overall balance assessment: Needs assistance;History of Falls Sitting-balance support: Feet supported;Bilateral upper extremity supported Sitting balance-Leahy Scale: Fair     Standing balance support: Bilateral upper extremity supported;During functional activity Standing balance-Leahy Scale: Poor                               Pertinent Vitals/Pain Pain Assessment: No/denies pain    Home Living Family/patient expects to be discharged to:: Skilled nursing facility Living Arrangements: Other relatives               Additional Comments: Pt lives with neice.  Pt still has her home but does not stay there.  Family working to get  this out of her name.      Prior Function Level of Independence: Needs assistance   Gait / Transfers Assistance Needed: uses a walker at all times and has for last year.    ADL's / Homemaking Assistance Needed: Pt has aid that comes one time a week to assist her into her shower.  Otherwise, pt is independent with basic adls with equipment.        Hand  Dominance   Dominant Hand: Right    Extremity/Trunk Assessment   Upper Extremity Assessment: Defer to OT evaluation           Lower Extremity Assessment: RLE deficits/detail;LLE deficits/detail RLE Deficits / Details: DF limited to neutral and strength 2/5.  H/o Rt knee pain which pt reports she recieves steroid shot every 6 months with the last one a week ago.  Knee flexion and extension strength 3/5. LLE Deficits / Details: strength grossly 3/5  Cervical / Trunk Assessment: Kyphotic  Communication   Communication: Expressive difficulties  Cognition Arousal/Alertness: Awake/alert Behavior During Therapy: WFL for tasks assessed/performed Overall Cognitive Status: No family/caregiver present to determine baseline cognitive functioning Area of Impairment: Orientation;Memory;Safety/judgement;Awareness;Problem solving;Following commands Orientation Level: Disoriented to;Time   Memory: Decreased short-term memory Following Commands: Follows one step commands with increased time Safety/Judgement: Decreased awareness of safety;Decreased awareness of deficits Awareness: Intellectual Problem Solving: Slow processing;Decreased initiation;Difficulty sequencing;Requires verbal cues;Requires tactile cues General Comments: Pt says it is dinner time even though she reads to the clock correctly as 2:30pm correctly.  She is now aware of her instability when asked about it.    General Comments      Exercises        Assessment/Plan    PT Assessment Patient needs continued PT services  PT Diagnosis Difficulty walking;Generalized weakness   PT Problem List Decreased strength;Decreased activity tolerance;Decreased balance;Decreased mobility;Decreased cognition;Decreased knowledge of use of DME;Decreased safety awareness;Decreased coordination  PT Treatment Interventions DME instruction;Gait training;Functional mobility training;Therapeutic activities;Therapeutic exercise;Balance  training;Neuromuscular re-education;Cognitive remediation;Patient/family education   PT Goals (Current goals can be found in the Care Plan section) Acute Rehab PT Goals Patient Stated Goal: none stated PT Goal Formulation: With patient Time For Goal Achievement: 12/31/15 Potential to Achieve Goals: Good    Frequency Min 3X/week   Barriers to discharge Decreased caregiver support does not have 24/7 assist    Co-evaluation               End of Session Equipment Utilized During Treatment: Gait belt Activity Tolerance: Patient limited by fatigue Patient left: in chair;with call bell/phone within reach;with chair alarm set Nurse Communication: Mobility status         Time: 1413-1436 PT Time Calculation (min) (ACUTE ONLY): 23 min   Charges:   PT Evaluation $PT Eval High Complexity: 1 Procedure PT Treatments $Therapeutic Activity: 8-22 mins   PT G Codes:       Collie Siad PT, DPT  Pager: [phone] Phone: [phone] 12/10/2015, 4:29 PM

## 2015-12-11 ENCOUNTER — Inpatient Hospital Stay (HOSPITAL_COMMUNITY): Payer: Medicare Other

## 2015-12-11 DIAGNOSIS — M6289 Other specified disorders of muscle: Secondary | ICD-10-CM

## 2015-12-11 DIAGNOSIS — I6789 Other cerebrovascular disease: Secondary | ICD-10-CM

## 2015-12-11 LAB — CBC
HCT: 35.9 % — ABNORMAL LOW (ref 36.0–46.0)
Hemoglobin: 11.4 g/dL — ABNORMAL LOW (ref 12.0–15.0)
MCH: 29.7 pg (ref 26.0–34.0)
MCHC: 31.8 g/dL (ref 30.0–36.0)
MCV: 93.5 fL (ref 78.0–100.0)
Platelets: 165 10*3/uL (ref 150–400)
RBC: 3.84 MIL/uL — AB (ref 3.87–5.11)
RDW: 13.7 % (ref 11.5–15.5)
WBC: 7.5 10*3/uL (ref 4.0–10.5)

## 2015-12-11 LAB — BASIC METABOLIC PANEL
ANION GAP: 9 (ref 5–15)
BUN: 26 mg/dL — ABNORMAL HIGH (ref 6–20)
CHLORIDE: 109 mmol/L (ref 101–111)
CO2: 23 mmol/L (ref 22–32)
CREATININE: 1.11 mg/dL — AB (ref 0.44–1.00)
Calcium: 8.9 mg/dL (ref 8.9–10.3)
GFR calc non Af Amer: 42 mL/min — ABNORMAL LOW (ref >60)
GFR, EST AFRICAN AMERICAN: 48 mL/min — AB (ref >60)
GLUCOSE: 105 mg/dL — AB (ref 65–99)
Potassium: 3.9 mmol/L (ref 3.5–5.1)
Sodium: 141 mmol/L (ref 135–145)

## 2015-12-11 LAB — GLUCOSE, CAPILLARY
Glucose-Capillary: 92 mg/dL (ref 65–99)
Glucose-Capillary: 146 mg/dL — ABNORMAL HIGH (ref 65–99)
Glucose-Capillary: 149 mg/dL — ABNORMAL HIGH (ref 65–99)
Glucose-Capillary: 132 mg/dL — ABNORMAL HIGH (ref 65–99)

## 2015-12-11 LAB — HEMOGLOBIN A1C
Hgb A1c MFr Bld: 6.1 % — ABNORMAL HIGH (ref 4.8–5.6)
MEAN PLASMA GLUCOSE: 128 mg/dL

## 2015-12-11 MED ORDER — PANTOPRAZOLE SODIUM 40 MG PO TBEC
40.0000 mg | DELAYED_RELEASE_TABLET | Freq: Every day | ORAL | Status: DC
  Administered 2015-12-12: 40 mg via ORAL
  Filled 2015-12-11: qty 1

## 2015-12-11 NOTE — Care Management Note (Signed)
Case Management Note  Patient Details  Name: [name] MRN: [id number] Date of Birth: 1922/01/03  Subjective/Objective:                    Action/Plan: Patient was admitted with CVA.  Current discharge recommendation is to SNF.  CSW following. Will follow for discharge needs pending discharge disposition.  Expected Discharge Date:                  Expected Discharge Plan:  Skilled Nursing Facility  In-House Referral:  Clinical Social Work  Discharge planning Services  CM Consult  Post Acute Care Choice:    Choice offered to:     DME Arranged:    DME Agency:     HH Arranged:    [place] Agency:     Status of Service:  In process, will continue to follow  Medicare Important Message Given:    Date Medicare IM Given:    Medicare IM give by:    Date Additional Medicare IM Given:    Additional Medicare Important Message give by:     If discussed at [place] of Stay Meetings, dates discussed:    Additional Comments:  Rolm Baptise, RN 12/11/2015, 11:17 AM [phone]

## 2015-12-11 NOTE — Progress Notes (Signed)
VASCULAR LAB PRELIMINARY  PRELIMINARY  PRELIMINARY  PRELIMINARY  Carotid duplex  completed.    Preliminary report:  Bilateral:  1-39% ICA stenosis.  Vertebral artery flow is antegrade.      Tuesday Terlecki, RVT 12/11/2015, 11:29 AM

## 2015-12-11 NOTE — Progress Notes (Signed)
  Echocardiogram 2D Echocardiogram has been performed.  [name] 12/11/2015, 5:54 PM

## 2015-12-11 NOTE — Care Management Note (Signed)
Case Management Note  Patient Details  Name: [name] MRN: [id number] Date of Birth: 1921-10-21  Subjective/Objective:     Pt admitted on 12/09/15 with possible stroke and TPA given.  PTA, pt independent, lives with niece.  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                 Action/Plan: PT/OT recommending SNF at dc, and CSW consulted to facilitate dc to SNF when medically stable.  Will follow progress.    Expected Discharge Date:                  Expected Discharge Plan:  Skilled Nursing Facility  In-House Referral:  Clinical Social Work  Discharge planning Services  CM Consult  Post Acute Care Choice:    Choice offered to:     DME Arranged:    DME Agency:     HH Arranged:    [place] Agency:     Status of Service:  In process, will continue to follow  Medicare Important Message Given:    Date Medicare IM Given:    Medicare IM give by:    Date Additional Medicare IM Given:    Additional Medicare Important Message give by:     If discussed at [place] of Stay Meetings, dates discussed:    Additional Comments:  Reinaldo Raddle, RN, BSN  Trauma/Neuro ICU Case Manager [phone]

## 2015-12-11 NOTE — Progress Notes (Signed)
STROKE TEAM PROGRESS NOTE   SUBJECTIVE (INTERVAL HISTORY) No family is at the bedside.  Patient just moved to the floor from the ICU. Doing well. RN reports mild confusion and foul smelling urine. She is ordering patient breakfast. Patient without complaints. Reports she lives in [place] with her niece and plans are for ST rehab at a nursing facility prior to returning home. She is ready for discharge when we are.    OBJECTIVE Temp:  [97.8 F (36.6 C)-98.9 F (37.2 C)] 97.8 F (36.6 C) (03/07 0843) Pulse Rate:  [53-83] 58 (03/07 0843) Cardiac Rhythm:  [-] Normal sinus rhythm (03/07 0856) Resp:  [14-26] 20 (03/07 0843) BP: (105-178)/(58-122) 168/73 mmHg (03/07 0843) SpO2:  [96 %-100 %] 100 % (03/07 0843)  CBC:   Recent Labs Lab 12/09/15 1220 12/09/15 1239 12/11/15 0339  WBC 12.4*  --  7.5  NEUTROABS 10.3*  --   --   HGB 13.2 15.0 11.4*  HCT 39.3 44.0 35.9*  MCV 93.3  --  93.5  PLT 199  --  [id number]    Basic Metabolic Panel:   Recent Labs Lab 12/09/15 1220 12/09/15 1239 12/11/15 0339  NA 140 143 141  K 3.9 3.8 3.9  CL 109 108 109  CO2 22  --  23  GLUCOSE 129* 124* 105*  BUN 36* 35* 26*  CREATININE 1.25* 1.20* 1.11*  CALCIUM 9.6  --  8.9    Lipid Panel:     Component Value Date/Time   CHOL 175 12/10/2015 0537   TRIG 107 12/10/2015 0537   HDL 59 12/10/2015 0537   CHOLHDL 3.0 12/10/2015 0537   VLDL 21 12/10/2015 0537   LDLCALC 95 12/10/2015 0537   HgbA1c:  Lab Results  Component Value Date   HGBA1C 6.1* 12/10/2015   Urine Drug Screen:     Component Value Date/Time   LABOPIA POSITIVE* 12/09/2015 1321   COCAINSCRNUR NONE DETECTED 12/09/2015 1321   LABBENZ NONE DETECTED 12/09/2015 1321   AMPHETMU NONE DETECTED 12/09/2015 1321   THCU NONE DETECTED 12/09/2015 1321   LABBARB NONE DETECTED 12/09/2015 1321      IMAGING  Ct Head Wo Contrast 12/09/2015  No acute intracranial findings. Diffuse age-related involutional change. Interval increase in ventricular  prominence since 2009, likely reflecting progressive dilatation related to progressive cerebral atrophy, although proportionality is difficult to determine definitively.   Mri & Mra Head/brain Wo Cm 12/10/2015  1. No evidence for acute or subacute infarction. 2. Mild to moderate generalized atrophy and moderate diffuse white matter disease. This likely reflects the sequela of chronic microvascular ischemia. 3. MRA circle of Willis without evidence for significant proximal stenosis, aneurysm, or branch vessel disease. Mild distal small vessel disease is present.    PHYSICAL EXAM Pleasant elderly lady currently not in distress. Afebrile. Head is nontraumatic. Cardiac exam no murmur or gallop. Lungs are clear to auscultation. Distal pulses are well felt. Neurological Exam :  Awake alert oriented 3 normal speech and language function. No aphasia or apraxia dysarthria. Pupils irregular but reactive. Vision acuity and fields seem adequate - can count fingers in all fields. Face is symmetric without weakness. Tongue is midline. Motor system exam reveals no upper or lower extremity drift. Right foot drop with ankle dorsiflexor weakness which is chronic. Sensation is intact bilaterally. Coordination is slow but accurate. Gait was not tested.   ASSESSMENT/PLAN [name] is a 82 y.o. female with history of hypertension, hyperlipidemia and memory loss  presenting with left leg weakness.Transferred  from an AP and status post IV t-PA 12/09/2015 at 1244.   R brain TIA s/pIV tPA.   Right lower back gum bleeding s/p IV tPA. Asymptomatic. No further bleeding after transfer to [facility]  Resultant  L leg weakness improving  MRI  No acute stroke. small vessel disease   MRA  No medium or large vessel stenosis  Cancel MRA neck and replace with Carotid Doppler  2D Echo  pending   LDL 95  HgbA1c pending  SCDs for VTE prophylaxis Diet regular Room service appropriate?: Yes; Fluid consistency::  Thin  No antithrombotic prior to admission, started on aspirin 81 mg daily   Ongoing aggressive stroke risk factor management  Therapy recommendations:  SNF. Patient agreeable to plan   Disposition:  Pending. Medically ready for discharge once 2D and carotid dopplers are completed  Hypertension  Stable  Hyperlipidemia  Home meds:  welchol bid  LDL 95, goal < 70  Added statin - Lipitor 10 mg daily  Continue statin at discharge  Other Stroke Risk Factors  Advanced age  Cigarette smoker, advised to stop smoking  Other Active Problems  Baseline memory deficits  CKD, Cr 1.11  Leukocytosis, white blood cells 12.4-> 7.5  UA 3/5 neg nitrates, trace leuko. No indication to repeat.  Hospital day # [place] for Pager information 12/11/2015 9:06 AM  I have personally examined this patient, reviewed notes, independently viewed imaging studies, participated in medical decision making and plan of care. I have made any additions or clarifications directly to the above note. Agree with note above.  Mobilize out of bed. Therapy consults. Hopefully discharge to SNF in next 1-2 days  [name], [place] Pager: [phone] 12/11/2015 2:39 PM   To contact Stroke Continuity provider, please refer to [email or web address] After hours, contact General Neurology

## 2015-12-11 NOTE — Clinical Social Work Placement (Addendum)
   CLINICAL SOCIAL WORK PLACEMENT  NOTE  Date:  12/11/2015  Patient Details  Name: [name] MRN: [id number] Date of Birth: [birth year]  Clinical Social Work is seeking post-discharge placement for this patient at the [place] level of care (*CSW will initial, date and re-position this form in  chart as items are completed):  Yes   Patient/family provided with [place] Work Department's list of facilities offering this level of care within the geographic area requested by the patient (or if unable, by the patient's family).  Yes   Patient/family informed of their freedom to choose among providers that offer the needed level of care, that participate in Medicare, Medicaid or managed care program needed by the patient, have an available bed and are willing to accept the patient.  Yes   Patient/family informed of [facility]'s ownership interest in [facility] and [facility], as well as of the fact that they are under no obligation to receive care at these facilities.  PASRR submitted to EDS on       PASRR number received on        Existing PASRR number confirmed on 12/11/15     FL2 transmitted to all facilities in geographic area requested by pt/family on 12/11/15     FL2 transmitted to all facilities within larger geographic area on       Patient informed that his/her managed care company has contracts with or will negotiate with certain facilities, including the following:          12-12-15 Patient/family informed of bed offers received. (updated Evette Cristal, MSW, LCSWA, 12/12/15)  Patient chooses bed at  [facility] (updated Evette Cristal, MSW, [place], 12/12/15)     Physician recommends and patient chooses bed at      Patient to be transferred to  [facility] on  12-12-15 (updated Evette Cristal, MSW, LCSWA, 12/12/15).  Patient to be transferred to facility by  [facility] (updated Evette Cristal, MSW, LCSWA, 12/12/15)      Patient family notified on  12-12-15 of transfer (updated Evette Cristal, MSW, LCSWA, 12/12/15).  Name of family member notified:    Carmelina Peal and Lenice Pressman  (updated Evette Cristal, MSW, LCSWA, 12/12/15)     PHYSICIAN Please sign FL2     Additional Comment:    Barbette Or, LCSW [phone]  Jones Broom. [place], MSW, [place] 12/12/2015 2:10 PM

## 2015-12-11 NOTE — Care Management Important Message (Signed)
Important Message  Patient Details  Name: [name] MRN: [id number] Date of Birth: 04/23/1922   Medicare Important Message Given:  Yes    Loann Quill 12/11/2015, 11:47 AM

## 2015-12-11 NOTE — NC FL2 (Signed)
[place] LEVEL OF CARE SCREENING TOOL     IDENTIFICATION  Patient Name: [name] Birthdate: 29-Sep-1922 Sex: female Admission Date (Current Location): 12/09/2015  [facility] and [place] Number:  [employer] and Address:  [facility], [place] [address] [place], [place] [address]      Provider Number: [id number]  Attending Physician Name and Address:  Garvin Fila, MD  Relative Name and Phone Number:       Current Level of Care: Hospital Recommended Level of Care: [place] Prior Approval Number:    Date Approved/Denied:   PASRR Number:    Discharge Plan: SNF    Current Diagnoses: Patient Active Problem List   Diagnosis Date Noted  . Acute ischemic stroke ([place]) 12/09/2015  . Stroke (cerebrum) ([place]) 12/09/2015  . OA (osteoarthritis) of knee 09/18/2011  . Alkaline phosphatase elevation 05/19/2011  . Anemia 05/19/2011  . Rhabdomyolysis 04/12/2011  . E-coli UTI 04/12/2011  . Headache(784.0) 04/12/2011  . SPONDYLOSIS 04/03/2010  . BACK PAIN 04/03/2010  . ULCER OF LOWER LIMB, UNSPECIFIED 11/08/2008  . OSTEOARTHRITIS, KNEES, BILATERAL 02/01/2008  . OSTEOPOROSIS 12/28/2007  . ALLERGIC RHINITIS, SEASONAL 12/14/2007  . MEMORY LOSS 11/26/2007  . HYPERLIPIDEMIA 07/12/2007  . HYPERTENSION 06/14/2007  . ASTHMA NOS W/ACUTE EXACERBATION 06/14/2007  . ARTHRITIS 06/14/2007  . PROTEINURIA 06/14/2007    Orientation RESPIRATION BLADDER Height & Weight     Self, Time, Situation, Place  Normal Continent Weight: 139 lb 12.4 oz (63.4 kg) Height:  5\' 5"  (165.1 cm)  BEHAVIORAL SYMPTOMS/MOOD NEUROLOGICAL BOWEL NUTRITION STATUS      Continent Diet (Normal)  AMBULATORY STATUS COMMUNICATION OF NEEDS Skin   Limited Assist Verbally Skin abrasions (Skin tear x2)                       Personal Care Assistance Level of Assistance  Bathing, Feeding, Dressing Bathing Assistance: Limited assistance Feeding assistance:  Limited assistance Dressing Assistance: Limited assistance     Functional Limitations Info  Sight, Hearing, Speech Sight Info: Adequate Hearing Info: Impaired Speech Info: Adequate    SPECIAL CARE FACTORS FREQUENCY  PT (By licensed PT), OT (By licensed OT), Speech therapy     PT Frequency: 3 OT Frequency: 3            Contractures Contractures Info: Not present    Additional Factors Info  Code Status, Allergies Code Status Info: Full Code Allergies Info: No Known Allergies           Current Medications (12/11/2015):  This is the current hospital active medication list Current Facility-Administered Medications  Medication Dose Route Frequency Provider Last Rate Last Dose  . acetaminophen (TYLENOL) tablet 650 mg  650 mg Oral Q4H PRN Ram Fuller Mandril, MD       Or  . acetaminophen (TYLENOL) suppository 650 mg  650 mg Rectal Q4H PRN Ram Fuller Mandril, MD      . aspirin EC tablet 81 mg  81 mg Oral Daily Donzetta Starch, NP   81 mg at 12/10/15 1645  . atorvastatin (LIPITOR) tablet 10 mg  10 mg Oral q1800 Donzetta Starch, NP   10 mg at 12/10/15 1801  . labetalol (NORMODYNE,TRANDATE) injection 10 mg  10 mg Intravenous Q10 min PRN Ram Fuller Mandril, MD   10 mg at 12/09/15 2011  . pantoprazole (PROTONIX) injection 40 mg  40 mg Intravenous QHS Ram Fuller Mandril, MD  40 mg at 12/10/15 2137  . senna-docusate (Senokot-S) tablet 1 tablet  1 tablet Oral QHS PRN Ram Fuller Mandril, MD         Discharge Medications: Please see discharge summary for a list of discharge medications.  Relevant Imaging Results:  Relevant Lab Results:   Additional Information SSN [id number]  Barbette Or, [place]     I have personally   reviewed notes,  , participated in medical decision making and plan of care.  . Agree with note above.    Antony Contras, MD Medical Director [facility] Pager: [phone] 12/11/2015 3:15  PM

## 2015-12-11 NOTE — Progress Notes (Signed)
Patient's niece said patient is hoping to go to [facility] or [place] as a second option. Hailee Hollick, Rande Brunt, RN

## 2015-12-11 NOTE — Progress Notes (Signed)
Physical Therapy Treatment Patient Details Name: [name] MRN: [id number] DOB: July 31, 1922 Today's Date: 12/11/2015    History of Present Illness Pt is a 82 yo female admitted from [facility] after receiving TPA.  Pt went to church Sunday am and felt weak in her LLE.  Pt in work up for CVA vs TIA. Pt had issue with R leg over a year ago and appears to have some residual weakness in the RLE from that episode.      PT Comments    Pt performed increased gait distance with +1 assist.  Pt extremely motivated to improve mobility.    Follow Up Recommendations  SNF;Supervision/Assistance - 24 hour     Equipment Recommendations  Wheelchair (measurements PT);Wheelchair cushion (measurements PT)    Recommendations for Other Services       Precautions / Restrictions Precautions Precautions: Fall Precaution Comments: Pt states she has not fallen since her last fall over a year ago. Restrictions Weight Bearing Restrictions: No    Mobility  Bed Mobility Overal bed mobility: Needs Assistance Bed Mobility: Supine to Sit     Supine to sit: Min assist     General bed mobility comments: Increased time and use of bed rail.  pt able to scoot to edge of bed without assistance.    Transfers Overall transfer level: Needs assistance Equipment used: Rolling walker (2 wheeled) Transfers: Sit to/from Stand Sit to Stand: Mod assist;Min assist Stand pivot transfers: Min assist;Mod assist       General transfer comment: Cues for hand placement to push to and from seated surface.  x1 from bed x3 from recliner and x1 from [facility].  Pt required cues for transition of hands from chair to RW.    Ambulation/Gait Ambulation/Gait assistance: Mod assist;+2 physical assistance;+2 safety/equipment Ambulation Distance (Feet): 40 Feet (+ 100 ft.  ) Assistive device: Rolling walker (2 wheeled) Gait Pattern/deviations: Step-through pattern;Trunk flexed;Staggering left;Staggering right     General Gait  Details: Pt ambulates in RW with valgus strain.  Pt required cues for RW position turns and backing.  No buckling noted during intervention.     Stairs            Wheelchair Mobility    Modified Rankin (Stroke Patients Only)       Balance     Sitting balance-Leahy Scale: Fair       Standing balance-Leahy Scale: Poor                      Cognition Arousal/Alertness: Awake/alert Behavior During Therapy: WFL for tasks assessed/performed                 Problem Solving: Slow processing;Decreased initiation;Difficulty sequencing;Requires verbal cues;Requires tactile cues      Exercises      General Comments        Pertinent Vitals/Pain Pain Assessment: No/denies pain    Home Living                      Prior Function            PT Goals (current goals can now be found in the care plan section) Acute Rehab PT Goals Patient Stated Goal: none stated Potential to Achieve Goals: Good Progress towards PT goals: Progressing toward goals    Frequency  Min 3X/week    PT Plan      Co-evaluation             End of  Session Equipment Utilized During Treatment: Gait belt Activity Tolerance: Patient limited by fatigue Patient left: in chair;with call bell/phone within reach;with chair alarm set     Time: [id number] PT Time Calculation (min) (ACUTE ONLY): 36 min  Charges:  $Gait Training: 8-22 mins $Therapeutic Activity: 8-22 mins                    G Codes:      Cristela Blue 2015-12-16, 4:27 PM  Governor Rooks, PTA pager [phone]

## 2015-12-11 NOTE — Clinical Social Work Note (Signed)
Clinical Social Work Assessment  Patient Details  Name: [name] MRN: [id number] Date of Birth: 30-Nov-1921  Date of referral:  12/10/15               Reason for consult:  Facility Placement                Permission sought to share information with:  Family Supports Permission granted to share information::  Yes, Verbal Permission Granted  Name::     Carmelina Peal  Relationship::  Niece  Contact Information:  [phone]  Housing/Transportation Living arrangements for the past 2 months:  Single Family Home Source of Information:  Patient, Other (Comment Required) (Niece at bedside) Patient Interpreter Needed:  None Criminal Activity/Legal Involvement Pertinent to Current Situation/Hospitalization:  No - Comment as needed Significant Relationships:  Other Family Members Lives with:  Relatives Do you feel safe going back to the place where you live?  Yes Need for family participation in patient care:  Yes (Comment)  Care giving concerns:  Patient niece does not express any concerns at this time.  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   Social Worker assessment / plan:  Holiday representative met with patient and patient niece at bedside to offer support and discuss patient needs at discharge.  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  Patient has been to [facility] in the past and would like to return for a couple weeks prior to return home.  Patient and family agreeable with plan.  CSW to complete FL2 and initiate referral to [facility].  CSW to follow up with facility regarding patient potential admission.  CSW remains available for support and to facilitate patient discharge needs once medically stable.  Employment status:  Part-Time, Retired Forensic scientist:   Medicare PT Recommendations:  [place] / Referral to community resources:  [place]  Patient/Family's Response to care:  Patient and family understanding and agreeable with current discharge plan.  Patient and family also verbalize their appreciation for CSW support and concern.  Patient/Family's Understanding of and Emotional Response to Diagnosis, Current Treatment, and Prognosis:  Patient and family understanding of patient current situation and medical status.  Patient family willing to provide necessary care to patient as needed.  Emotional Assessment Appearance:  Appears younger than stated age Attitude/Demeanor/Rapport:   (Appropriate and Cooperative) Affect (typically observed):  Appropriate, Calm, Pleasant Orientation:  Oriented to Self, Oriented to Place, Oriented to  Time, Oriented to Situation Alcohol / Substance use:  Not Applicable Psych involvement (Current and /or in the community):  No (Comment)  Discharge Needs  Concerns to be addressed:  Discharge Planning Concerns Readmission within the last 30 days:  No Current discharge risk:  None Barriers to Discharge:  Continued Medical Work up  [employer] [place]

## 2015-12-12 ENCOUNTER — Inpatient Hospital Stay
Admission: RE | Admit: 2015-12-12 | Discharge: 2016-02-11 | Disposition: A | Discharge status: Home / Self Care | Payer: Medicare Other | Source: Ambulatory Visit | Attending: Internal Medicine | Admitting: Internal Medicine

## 2015-12-12 DIAGNOSIS — M179 Osteoarthritis of knee, unspecified: Secondary | ICD-10-CM | POA: Diagnosis not present

## 2015-12-12 DIAGNOSIS — H26493 Other secondary cataract, bilateral: Secondary | ICD-10-CM | POA: Diagnosis not present

## 2015-12-12 DIAGNOSIS — F17210 Nicotine dependence, cigarettes, uncomplicated: Secondary | ICD-10-CM | POA: Diagnosis present

## 2015-12-12 DIAGNOSIS — I10 Essential (primary) hypertension: Secondary | ICD-10-CM | POA: Diagnosis not present

## 2015-12-12 DIAGNOSIS — R260 Ataxic gait: Secondary | ICD-10-CM | POA: Diagnosis not present

## 2015-12-12 DIAGNOSIS — N189 Chronic kidney disease, unspecified: Secondary | ICD-10-CM | POA: Diagnosis not present

## 2015-12-12 DIAGNOSIS — D509 Iron deficiency anemia, unspecified: Secondary | ICD-10-CM | POA: Diagnosis not present

## 2015-12-12 DIAGNOSIS — N182 Chronic kidney disease, stage 2 (mild): Secondary | ICD-10-CM | POA: Diagnosis not present

## 2015-12-12 DIAGNOSIS — H524 Presbyopia: Secondary | ICD-10-CM | POA: Diagnosis not present

## 2015-12-12 DIAGNOSIS — K2960 Other gastritis without bleeding: Secondary | ICD-10-CM | POA: Diagnosis not present

## 2015-12-12 DIAGNOSIS — R278 Other lack of coordination: Secondary | ICD-10-CM | POA: Diagnosis not present

## 2015-12-12 DIAGNOSIS — R609 Edema, unspecified: Secondary | ICD-10-CM | POA: Diagnosis not present

## 2015-12-12 DIAGNOSIS — R41841 Cognitive communication deficit: Secondary | ICD-10-CM | POA: Diagnosis not present

## 2015-12-12 DIAGNOSIS — K5909 Other constipation: Secondary | ICD-10-CM | POA: Diagnosis not present

## 2015-12-12 DIAGNOSIS — E785 Hyperlipidemia, unspecified: Secondary | ICD-10-CM | POA: Diagnosis not present

## 2015-12-12 DIAGNOSIS — I639 Cerebral infarction, unspecified: Secondary | ICD-10-CM | POA: Diagnosis not present

## 2015-12-12 DIAGNOSIS — G458 Other transient cerebral ischemic attacks and related syndromes: Secondary | ICD-10-CM | POA: Diagnosis not present

## 2015-12-12 DIAGNOSIS — I63119 Cerebral infarction due to embolism of unspecified vertebral artery: Secondary | ICD-10-CM | POA: Diagnosis not present

## 2015-12-12 DIAGNOSIS — Z961 Presence of intraocular lens: Secondary | ICD-10-CM | POA: Diagnosis not present

## 2015-12-12 DIAGNOSIS — I999 Unspecified disorder of circulatory system: Secondary | ICD-10-CM | POA: Diagnosis not present

## 2015-12-12 DIAGNOSIS — M6281 Muscle weakness (generalized): Secondary | ICD-10-CM | POA: Diagnosis not present

## 2015-12-12 DIAGNOSIS — G459 Transient cerebral ischemic attack, unspecified: Secondary | ICD-10-CM | POA: Diagnosis not present

## 2015-12-12 DIAGNOSIS — R262 Difficulty in walking, not elsewhere classified: Secondary | ICD-10-CM | POA: Diagnosis not present

## 2015-12-12 DIAGNOSIS — Z8673 Personal history of transient ischemic attack (TIA), and cerebral infarction without residual deficits: Secondary | ICD-10-CM | POA: Diagnosis not present

## 2015-12-12 DIAGNOSIS — H35023 Exudative retinopathy, bilateral: Secondary | ICD-10-CM | POA: Diagnosis not present

## 2015-12-12 LAB — GLUCOSE, CAPILLARY
Glucose-Capillary: 100 mg/dL — ABNORMAL HIGH (ref 65–99)
Glucose-Capillary: 83 mg/dL (ref 65–99)

## 2015-12-12 MED ORDER — ASPIRIN 81 MG PO TBEC: 81.0000 mg | DELAYED_RELEASE_TABLET | Freq: Every day | ORAL | Status: AC

## 2015-12-12 MED ORDER — ATORVASTATIN CALCIUM 10 MG PO TABS: 10.0000 mg | ORAL_TABLET | Freq: Every day | ORAL | Status: AC

## 2015-12-12 NOTE — Care Management Note (Signed)
Case Management Note  Patient Details  Name: [name] MRN: [id number] Date of Birth: [birth year]  Subjective/Objective:                    Action/Plan: Patient is discharging to [facility]. No further needs per CM.   Expected Discharge Date:                  Expected Discharge Plan:  Skilled Nursing Facility  In-House Referral:  Clinical Social Work  Discharge planning Services  CM Consult  Post Acute Care Choice:    Choice offered to:     DME Arranged:    DME Agency:     HH Arranged:    [place] Agency:     Status of Service:  In process, will continue to follow  Medicare Important Message Given:  Yes Date Medicare IM Given:    Medicare IM give by:    Date Additional Medicare IM Given:    Additional Medicare Important Message give by:     If discussed at [place] of Stay Meetings, dates discussed:    Additional Comments:  Pollie Friar, RN 12/12/2015, 3:30 PM

## 2015-12-12 NOTE — Progress Notes (Signed)
Patient is being d/c to a nursing home. Report called to the receiving nurse. Patient awaiting transportation at this time.

## 2015-12-12 NOTE — Discharge Summary (Signed)
Stroke Discharge Summary  Patient ID: [name]   MRN: [id number]      DOB: May 15, 1922  Date of Admission: 12/09/2015 Date of Discharge: 12/12/2015  Attending Physician:  Garvin Fila, MD, Stroke MD Consulting Physician(s):     None  Patient's PCP:  [name], MD  DISCHARGE DIAGNOSIS:  Principal Problem:   Presumed Acute ischemic stroke ([place]) status post IV TPA. Final diagnosis: Right brain TIA Active Problems:   Gum bleeding status post IV TPA, resolved   Hyperlipidemia LDL goal <70   Essential hypertension   Memory loss   Stroke (cerebrum) (HCC)   CKD (chronic kidney disease)   Cigarette smoker  BMI: Body mass index is 23.26 kg/(m^2).  Past Medical History  Diagnosis Date  . Memory loss   . Leukopenia   . Hyponatremia   . Other and unspecified hyperlipidemia   . Proteinuria   . Edema   . Unspecified asthma, with exacerbation   . Arthropathy, unspecified, site unspecified   . Unspecified essential hypertension   . S/P colonoscopy Jan 2012    anal papilla, internal hemorrhoids, melanosis coli  . Hyperlipidemia    Past Surgical History  Procedure Laterality Date  . None    . Esophagogastroduodenoscopy  06/16/2011    Dr. Oneida Alar: moderate gastritis, no h.pylori  . Colonoscopy  11/05/2010    Dr. Gala Romney: Anal papilla and internal hemorrhoids pigmented rectal mucosa consistent with melanosis coli/ Diffuse pigmentation of the colonic mucosa consistent with melanosis coli, localized cecal diverticula as described above      Medication List    STOP taking these medications        colesevelam 625 MG tablet  Commonly known as:  WELCHOL      TAKE these medications        acetaminophen 325 MG tablet  Commonly known as:  TYLENOL  Take 650 mg by mouth every 6 (six) hours as needed for mild pain.     amLODipine 5 MG tablet  Commonly known as:  NORVASC  Take 5 mg by mouth daily.     aspirin 81 MG EC tablet  Take 1 tablet (81 mg total) by mouth daily.      atorvastatin 10 MG tablet  Commonly known as:  LIPITOR  Take 1 tablet (10 mg total) by mouth daily at 6 PM.     furosemide 40 MG tablet  Commonly known as:  LASIX  Take 40 mg by mouth daily as needed for fluid. Take one tablet by mouth every 4 days as needed for fluid     HYDROcodone-acetaminophen 5-325 MG tablet  Commonly known as:  NORCO/VICODIN  Take 1 tablet by mouth 2 (two) times daily. Take two every day per Niece     IRON PO  Take 325 mg by mouth 2 (two) times daily.     omeprazole 20 MG capsule  Commonly known as:  PRILOSEC  Take 1 capsule (20 mg total) by mouth every morning. 30 minutes before breakfast.     polyethylene glycol packet  Commonly known as:  MIRALAX / GLYCOLAX  Take 17 g by mouth daily as needed for mild constipation.     ramipril 10 MG capsule  Commonly known as:  ALTACE  Take 10 mg by mouth daily.        LABORATORY STUDIES CBC    Component Value Date/Time   WBC 7.5 12/11/2015 0339   RBC 3.84* 12/11/2015 0339   HGB 11.4* 12/11/2015 [id number]  HCT 35.9* 12/11/2015 0339   PLT 165 12/11/2015 0339   MCV 93.5 12/11/2015 0339   MCH 29.7 12/11/2015 0339   MCHC 31.8 12/11/2015 0339   RDW 13.7 12/11/2015 0339   LYMPHSABS 1.0 12/09/2015 1220   MONOABS 1.1* 12/09/2015 1220   EOSABS 0.0 12/09/2015 1220   BASOSABS 0.0 12/09/2015 1220   CMP    Component Value Date/Time   NA 141 12/11/2015 0339   K 3.9 12/11/2015 0339   CL 109 12/11/2015 0339   CO2 23 12/11/2015 0339   GLUCOSE 105* 12/11/2015 0339   BUN 26* 12/11/2015 0339   CREATININE 1.11* 12/11/2015 0339   CREATININE 0.92 06/20/2011 1200   CALCIUM 8.9 12/11/2015 0339   PROT 7.8 12/09/2015 1220   ALBUMIN 3.8 12/09/2015 1220   AST 38 12/09/2015 1220   ALT 24 12/09/2015 1220   ALKPHOS 193* 12/09/2015 1220   BILITOT 0.6 12/09/2015 1220   GFRNONAA 42* 12/11/2015 0339   GFRAA 48* 12/11/2015 0339   COAGS Lab Results  Component Value Date   INR 0.99 12/09/2015   INR 0.97 06/20/2011   Lipid  Panel    Component Value Date/Time   CHOL 175 12/10/2015 0537   TRIG 107 12/10/2015 0537   HDL 59 12/10/2015 0537   CHOLHDL 3.0 12/10/2015 0537   VLDL 21 12/10/2015 0537   LDLCALC 95 12/10/2015 0537   HgbA1C  Lab Results  Component Value Date   HGBA1C 6.1* 12/10/2015   Urinalysis    Component Value Date/Time   COLORURINE YELLOW 12/09/2015 1321   APPEARANCEUR HAZY* 12/09/2015 1321   LABSPEC 1.025 12/09/2015 1321   PHURINE 5.5 12/09/2015 1321   GLUCOSEU NEGATIVE 12/09/2015 1321   HGBUR MODERATE* 12/09/2015 1321   HGBUR trace-intact 02/27/2009 0956   BILIRUBINUR NEGATIVE 12/09/2015 1321   KETONESUR NEGATIVE 12/09/2015 1321   PROTEINUR 100* 12/09/2015 1321   UROBILINOGEN 0.2 04/11/2011 0803   NITRITE NEGATIVE 12/09/2015 1321   LEUKOCYTESUR TRACE* 12/09/2015 1321   Urine Drug Screen     Component Value Date/Time   LABOPIA POSITIVE* 12/09/2015 1321   COCAINSCRNUR NONE DETECTED 12/09/2015 1321   LABBENZ NONE DETECTED 12/09/2015 1321   AMPHETMU NONE DETECTED 12/09/2015 1321   THCU NONE DETECTED 12/09/2015 1321   LABBARB NONE DETECTED 12/09/2015 1321    Alcohol Level    Component Value Date/Time   ETH <5 12/09/2015 1220     SIGNIFICANT DIAGNOSTIC STUDIES Ct Head Wo Contrast 12/09/2015 No acute intracranial findings. Diffuse age-related involutional change. Interval increase in ventricular prominence since 2009, likely reflecting progressive dilatation related to progressive cerebral atrophy, although proportionality is difficult to determine definitively.   Mri & Mra Head/brain Wo Cm 12/10/2015 1. No evidence for acute or subacute infarction. 2. Mild to moderate generalized atrophy and moderate diffuse white matter disease. This likely reflects the sequela of chronic microvascular ischemia. 3. MRA circle of Willis without evidence for significant proximal stenosis, aneurysm, or branch vessel disease. Mild distal small vessel disease is present.   Carotid Doppler   There  is 1-39% bilateral ICA stenosis. Vertebral artery flow is antegrade.    2D Echocardiogram  - Left ventricle: The cavity size was normal. Wall thickness was normal. Systolic function was normal. The estimated ejection fraction was in the range of 55% to 60%. Wall motion was normal; there were no regional wall motion abnormalities. Doppler parameters are consistent with abnormal left ventricular relaxation (grade 1 diastolic dysfunction). - Aortic valve: There was mild regurgitation. - Pulmonary arteries: Systolic pressure was  moderately increased. PA peak pressure: 43 mm Hg (S).   HISTORY OF PRESENT ILLNESS [name] is an 82 y.o. female patient who was taken to the [facility] emergency room via EMS for evaluation of acute onset of leg weakness on the left. Patient unable to provide history clearly. Per discussion with Dr. Sabra Heck who called for transfer the patient to [facility], Patient developed acute left leg weakness this morning 12/09/2015 at 10:10 AM (LKW). She continued to have progressive weakness in left lower extremity when she went to the church. She was evaluated in the [place] and was noted to be a candidate for IV TPA given her acute onset of left leg weakness. After completing IV TPA infusion, as he does not have a neurologic coverage over this weekend, Dr. Sabra Heck called for transfer the patient to [facility] for continued Post TPA neurological monitoring. She has been transferred directly to the neuro ICU at the [facility]. She reports resolution of the left leg weakness at this time. She denied any other new neurological symptoms.    HOSPITAL COURSE [name] is a 82 y.o. female with history of hypertension, hyperlipidemia and memory loss presenting with left leg weakness.Transferred from an AP status post IV t-PA 12/09/2015 at 1244. Admitted to the neuro ICU. Tolerated TPA without difficulty. MRI with no acute stroke. Felt to have had a right  brain TIA without residual deficits. Plans for discharge to skilled nursing facility for stabilization prior to likely return home.  R brain TIA s/p IV tPA.   Right lower back gum bleeding s/p IV tPA. Asymptomatic. No further bleeding after transfer to [facility]  Resultant L leg weakness improving  MRI No acute stroke. small vessel disease   MRA No medium or large vessel stenosis  Carotid Doppler no significant stenosis  2D Echo no source of embolus  LDL 95  HgbA1c 6.1  No antithrombotic prior to admission, started on aspirin 81 mg daily   Ongoing aggressive stroke risk factor management  Therapy recommendations: SNF. Patient agreeable to plan   Disposition: SNF placement  Hypertension  Stable  Hyperlipidemia  Home meds: welchol bid  LDL 95, goal < 70  Added statin - Lipitor 10 mg daily  Continue statin at discharge  Other Stroke Risk Factors  Advanced age  Cigarette smoker, advised to stop smoking  Other Active Problems  Baseline memory deficits  CKD, Cr 1.11  Leukocytosis, resolved. white blood cells 12.4-> 7.5  UA 3/5 neg nitrates, trace leuko. No indication to repeat.   DISCHARGE EXAM Blood pressure 145/80, pulse 71, temperature 98.2 F (36.8 C), temperature source Oral, resp. rate 16, height 5\' 5"  (1.651 m), weight 63.4 kg (139 lb 12.4 oz), SpO2 98 %. Pleasant elderly lady currently not in distress. Afebrile. Head is nontraumatic. Cardiac exam no murmur or gallop. Lungs are clear to auscultation. Distal pulses are well felt. Neurological Exam :  Awake alert oriented 3 normal speech and language function. No aphasia or apraxia dysarthria. Pupils irregular but reactive. Vision acuity and fields seem adequate - can count fingers in all fields. Face is symmetric without weakness. Tongue is midline. Motor system exam reveals no upper or lower extremity drift. Right foot drop with ankle dorsiflexor weakness which is chronic. Sensation is intact  bilaterally. Coordination is slow but accurate. Gait was not tested.  Discharge Diet   Diet regular Room service appropriate?: Yes; Fluid consistency:: Thin liquids  DISCHARGE PLAN  Disposition:  Discharge to skilled  nursing facility for ongoing PT, OT and ST.    aspirin 81 mg daily for secondary stroke prevention.  Follow-up FANTA,TESFAYE, MD in 2 weeks.  Follow-up with Cecille Rubin, NP for Dr. Antony Contras, Stroke Clinic in 2 months.  35 minutes were spent preparing discharge.  [place] for Pager information 12/12/2015 1:30 PM  I have personally examined this patient, reviewed notes, independently viewed imaging studies, participated in medical decision making and plan of care. I have made any additions or clarifications directly to the above note. Agree with note above.   Antony Contras, MD Medical Director [facility] Pager: [phone] 12/12/2015 1:59 PM

## 2015-12-12 NOTE — Clinical Social Work Note (Signed)
Patient to be d/c'ed today to [facility].  Patient and family agreeable to plans will transport via ems RN to call report [phone].    Evette Cristal, MSW, [place]

## 2015-12-13 ENCOUNTER — Encounter (HOSPITAL_COMMUNITY)
Admission: RE | Admit: 2015-12-13 | Discharge: 2015-12-13 | Disposition: A | Discharge status: Home / Self Care | Payer: Medicare Other | Source: Skilled Nursing Facility | Attending: Internal Medicine | Admitting: Internal Medicine

## 2015-12-13 DIAGNOSIS — I639 Cerebral infarction, unspecified: Secondary | ICD-10-CM | POA: Insufficient documentation

## 2015-12-13 DIAGNOSIS — E785 Hyperlipidemia, unspecified: Secondary | ICD-10-CM | POA: Insufficient documentation

## 2015-12-13 DIAGNOSIS — K5909 Other constipation: Secondary | ICD-10-CM | POA: Insufficient documentation

## 2015-12-13 DIAGNOSIS — M6281 Muscle weakness (generalized): Secondary | ICD-10-CM | POA: Insufficient documentation

## 2015-12-13 DIAGNOSIS — I10 Essential (primary) hypertension: Secondary | ICD-10-CM | POA: Insufficient documentation

## 2015-12-13 LAB — CBC
HEMATOCRIT: 33.6 % — AB (ref 36.0–46.0)
HEMOGLOBIN: 11.2 g/dL — AB (ref 12.0–15.0)
MCH: 31.4 pg (ref 26.0–34.0)
MCHC: 33.3 g/dL (ref 30.0–36.0)
MCV: 94.1 fL (ref 78.0–100.0)
Platelets: 191 10*3/uL (ref 150–400)
RBC: 3.57 MIL/uL — ABNORMAL LOW (ref 3.87–5.11)
RDW: 13.5 % (ref 11.5–15.5)
WBC: 8 10*3/uL (ref 4.0–10.5)

## 2015-12-13 LAB — BASIC METABOLIC PANEL
Anion gap: 5 (ref 5–15)
BUN: 23 mg/dL — ABNORMAL HIGH (ref 6–20)
CHLORIDE: 108 mmol/L (ref 101–111)
CO2: 26 mmol/L (ref 22–32)
CREATININE: 0.92 mg/dL (ref 0.44–1.00)
Calcium: 8.7 mg/dL — ABNORMAL LOW (ref 8.9–10.3)
GFR calc non Af Amer: 52 mL/min — ABNORMAL LOW (ref >60)
Glucose, Bld: 99 mg/dL (ref 65–99)
Potassium: 3.9 mmol/L (ref 3.5–5.1)
Sodium: 139 mmol/L (ref 135–145)

## 2015-12-15 ENCOUNTER — Non-Acute Institutional Stay (SKILLED_NURSING_FACILITY): Payer: Medicare Other | Admitting: Internal Medicine

## 2015-12-15 DIAGNOSIS — D509 Iron deficiency anemia, unspecified: Secondary | ICD-10-CM | POA: Diagnosis not present

## 2015-12-15 DIAGNOSIS — M179 Osteoarthritis of knee, unspecified: Secondary | ICD-10-CM

## 2015-12-15 DIAGNOSIS — G459 Transient cerebral ischemic attack, unspecified: Secondary | ICD-10-CM

## 2015-12-15 DIAGNOSIS — N182 Chronic kidney disease, stage 2 (mild): Secondary | ICD-10-CM | POA: Diagnosis not present

## 2015-12-15 DIAGNOSIS — E785 Hyperlipidemia, unspecified: Secondary | ICD-10-CM | POA: Diagnosis not present

## 2015-12-15 DIAGNOSIS — M1712 Unilateral primary osteoarthritis, left knee: Secondary | ICD-10-CM

## 2015-12-15 DIAGNOSIS — M1711 Unilateral primary osteoarthritis, right knee: Secondary | ICD-10-CM

## 2015-12-15 DIAGNOSIS — R260 Ataxic gait: Secondary | ICD-10-CM | POA: Diagnosis not present

## 2015-12-15 NOTE — Progress Notes (Signed)
Patient ID: [name], female   DOB: 1921/12/15, 82 y.o.   MRN: [id number]     Facility; [facility] Chief complaint; admission to SNF post admit to [facility] from 3/5 to 12/12/2015  History;  this is a 82 year old woman who lives in [place] with his niece according to her. She also tells me she was here 2 years ago for right leg weakness although I don't see reference to that however it is possible she cred for by antoher staff member.. In any case she presented with acute onset of weakness in her left leg. The patient states she was getting ready for church and noted that she could not stand on the left leg. She states that normally her right leg is the week leg. She was given TPA. She was transferred to neuro ICU with cone. The left leg weakness resolved. She had no other neurologic symptoms. MRI showed no acute stroke. She was felt to have a right brain TIA without residual deficits. She was started on aspirin for secondary stroke prevention. The patient currently denies weakness or numbness in the left or right leg. States her vision is stable, swallowing good no complaints in her upper extremities.  BMP Latest Ref Rng 12/13/2015 12/11/2015 12/09/2015  Glucose 65 - 99 mg/dL 99 105(H) 124(H)  BUN 6 - 20 mg/dL 23(H) 26(H) 35(H)  Creatinine 0.44 - 1.00 mg/dL 0.92 1.11(H) 1.20(H)  Sodium 135 - 145 mmol/L 139 141 143  Potassium 3.5 - 5.1 mmol/L 3.9 3.9 3.8  Chloride 101 - 111 mmol/L 108 109 108  CO2 22 - 32 mmol/L 26 23 -  Calcium 8.9 - 10.3 mg/dL 8.7(L) 8.9 -   CBC Latest Ref Rng 12/13/2015 12/11/2015 12/09/2015  WBC 4.0 - 10.5 K/uL 8.0 7.5 -  Hemoglobin 12.0 - 15.0 g/dL 11.2(L) 11.4(L) 15.0  Hematocrit 36.0 - 46.0 % 33.6(L) 35.9(L) 44.0  Platelets 150 - 400 K/uL 191 165 -       Result status: Final result                                 *[facility]*                         [address], [place] [address]                     [phone]   ------------------------------------------------------------------- Transthoracic Echocardiography   Patient:    [name], [name] MR #:       [id number] Study Date: 12/11/2015 Gender:     F Age:        54 Height:     165.1 cm Weight:     63.4 kg BSA:        1.71 m^2 Pt. Status: Room:       [place], [place]  SONOGRAPHER  Johny Chess, RDCS, CCT  PERFORMING   [facility], Inpatient  ADMITTING    Nandigam, Ram Jerelyn Scott     Nipomo, Ram Greenwood   cc:   ------------------------------------------------------------------- LV EF: 55% -  60%   ------------------------------------------------------------------- Indications:      CVA 436.   ------------------------------------------------------------------- History:   Risk factors:  Hypertension. Dyslipidemia.   ------------------------------------------------------------------- Study Conclusions   - Left ventricle: The cavity size was normal. Wall thickness was   normal. Systolic function was normal. The estimated ejection   fraction was in the range of 55% to 60%. Wall motion was normal;   there were no regional wall motion abnormalities. Doppler   parameters are consistent with abnormal left ventricular   relaxation (grade 1 diastolic dysfunction). - Aortic valve: There was mild regurgitation. - Pulmonary arteries: Systolic pressure was moderately increased.   PA peak pressure: 43 mm Hg (S).   Transthoracic echocardiography.  M-mode, complete 2D, spectral Doppler, and color Doppler.  Birthdate:  Patient birthdate: [birth year].  Age:  Patient is 82 yr old.  Sex:  Gender: female. BMI: 23.3 kg/m^2.  Blood pressure:     151/81  Patient status: Inpatient.  Study date:  Study date: 12/11/2015. Study time: 05:15 PM.  Location:  Bedside.   -------------------------------------------------------------------     ------------------------------------------------------------------- Left ventricle:  The cavity size was normal. Wall thickness was normal. Systolic function was normal. The estimated ejection fraction was in the range of 55% to 60%. Wall motion was normal; there were no regional wall motion abnormalities. Doppler parameters are consistent with abnormal left ventricular relaxation (grade 1 diastolic dysfunction).   ------------------------------------------------------------------- Aortic valve:   Moderately thickened, moderately calcified leaflets.  Doppler:   There was no stenosis.   There was mild regurgitation.   ------------------------------------------------------------------- Aorta:  Aortic root: The aortic root was normal in size. Ascending aorta: The ascending aorta was normal in size.   ------------------------------------------------------------------- Mitral valve:   Structurally normal valve.   Leaflet separation was normal.  Doppler:  Transvalvular velocity was within the normal range. There was no evidence for stenosis. There was no regurgitation.   ------------------------------------------------------------------- Left atrium:  The atrium was normal in size.   ------------------------------------------------------------------- Right ventricle:  The cavity size was normal. Systolic function was normal.   ------------------------------------------------------------------- Pulmonic valve:    The valve appears to be grossly normal. Doppler:  There was no significant regurgitation.   ------------------------------------------------------------------- Tricuspid valve:   Structurally normal valve.   Leaflet separation was normal.  Doppler:  Transvalvular velocity was within the normal range. There was mild regurgitation.   ------------------------------------------------------------------- Pulmonary artery:   Systolic pressure was moderately increased.    ------------------------------------------------------------------- Right atrium:  The atrium was normal in size.   ------------------------------------------------------------------- Pericardium:  There was no pericardial effusion.   ------------------------------------------------------------------- Systemic veins: Inferior vena cava: The vessel was normal in size. The respirophasic diameter changes were in the normal range (>= 50%), consistent with normal central venous pressure.   -------------------------------------------------------------------   Symptoms began at 10:10 yesterday. TPA was given.   EXAM: MRI HEAD WITHOUT CONTRAST   MRA HEAD WITHOUT CONTRAST   TECHNIQUE: Multiplanar, multiecho pulse sequences of the brain and surrounding structures were obtained without intravenous contrast. Angiographic images of the head were obtained using MRA technique without contrast.   COMPARISON:  CT head without contrast 12/09/2015.   FINDINGS: MRI HEAD FINDINGS   The diffusion-weighted images demonstrate no evidence for acute or subacute infarction. No acute hemorrhage or mass lesion is present. Mild to moderate generalized atrophy is present. There is moderate diffuse confluent periventricular and subcortical white matter disease bilaterally. The basal ganglia are intact. Mild white matter changes extend into the brainstem. The cerebellum is within normal limits.  Flow is present in the major intracranial arteries. Bilateral lens replacements are present. The globes and orbits are otherwise intact. There is some fluid in the right mastoid air cells. The paranasal sinuses and mastoid air cells are otherwise clear.   The skullbase is within normal limits. Midline sagittal images are unremarkable.   MRA HEAD FINDINGS   Moderate tortuosity is present within the cervical internal carotid arteries bilaterally. There is no focal stenosis through the ICA terminus. The  right A1 segment is aplastic. Both A2 segments are opacified with a patent anterior communicating artery. The MCA bifurcations are intact. The ACA and MCA branch vessels are within normal limits. There is mild attenuation of distal small vessels.   The right vertebral artery is slightly dominant left. The PICA origins are visualized and normal. The basilar artery is normal. Both posterior cerebral arteries originate from the basilar tip. The PCA branch vessels are within normal limits.   IMPRESSION: 1. No evidence for acute or subacute infarction. 2. Mild to moderate generalized atrophy and moderate diffuse white matter disease. This likely reflects the sequela of chronic microvascular ischemia. 3. MRA circle of Willis without evidence for significant proximal stenosis, aneurysm, or branch vessel disease. Mild distal small vessel disease is present.     Electronically Signed   By: San Morelle M.D.   On: 12/10/2015 14:15    Past Medical History  Diagnosis Date  . Memory loss   . Leukopenia   . Hyponatremia   . Other and unspecified hyperlipidemia   . Proteinuria   . Edema   . Unspecified asthma, with exacerbation   . Arthropathy, unspecified, site unspecified   . Unspecified essential hypertension   . S/P colonoscopy Jan 2012    anal papilla, internal hemorrhoids, melanosis coli  . Hyperlipidemia    Past Surgical History  Procedure Laterality Date  . None    . Esophagogastroduodenoscopy  06/16/2011    Dr. Oneida Alar: moderate gastritis, no h.pylori  . Colonoscopy  11/05/2010    Dr. Gala Romney: Anal papilla and internal hemorrhoids pigmented rectal mucosa consistent with melanosis coli/ Diffuse pigmentation of the colonic mucosa consistent with melanosis coli, localized cecal diverticula as described above    Current Outpatient Prescriptions on File Prior to Visit  Medication Sig Dispense Refill  . acetaminophen (TYLENOL) 325 MG tablet Take 650 mg by mouth every 6  (six) hours as needed for mild pain.     Marland Kitchen amLODipine (NORVASC) 5 MG tablet Take 5 mg by mouth daily.    Marland Kitchen aspirin EC 81 MG EC tablet Take 1 tablet (81 mg total) by mouth daily. 30 tablet 2  . atorvastatin (LIPITOR) 10 MG tablet Take 1 tablet (10 mg total) by mouth daily at 6 PM. 30 tablet 2  . furosemide (LASIX) 40 MG tablet Take 40 mg by mouth daily as needed for fluid. Take one tablet by mouth every 4 days as needed for fluid    . HYDROcodone-acetaminophen (NORCO/VICODIN) 5-325 MG tablet Take 1 tablet by mouth 2 (two) times daily. Take two every day per Niece    . IRON PO Take 325 mg by mouth 2 (two) times daily.     Marland Kitchen omeprazole (PRILOSEC) 20 MG capsule Take 1 capsule (20 mg total) by mouth every morning. 30 minutes before breakfast. 30 capsule 11  . polyethylene glycol (MIRALAX / GLYCOLAX) packet Take 17 g by mouth daily as needed for mild constipation.     . ramipril (ALTACE) 10 MG capsule Take 10 mg  by mouth daily.      Social; patient tells me that she lives with a niece in [place] for about the last year and a half. Prior to this she was on her own in [place]. States she still owns her own home here. Uses a walker at home to walk. I am not sure of her exact functional status.  reports that she has quit smoking. Her smoking use included Cigarettes. She has never used smokeless tobacco. She reports that she does not drink alcohol or use illicit drugs.  family history is negative for Colon cancer.  Review of systems Gen. patient states she feels well. No weight loss HEENT no visual disturbances, uses bifocals. No headache no swallowing difficulties Respiratory no shortness of breath and cough Cardiac no chest pain or palpitations GI no nausea vomiting or abdominal pain or diarrhea GU no dysuria denies incontinence Musculoskeletal denies joint or back pain Neurologic; denies visual loss. No weakness numbness in her upper extremities. States the weakness was present in her left leg is  better. Gait states she needs a walker to walk Psychiatric denies depressive references  Physical examination Gen. patient is awake alert conversational very pleasant HEENT oral exam is normal uvula is midline hearing seems adequate. Respiratory clear entry bilaterally Cardiac heart sounds are normal no murmurs no carotid bruits. Abdomen no liver no spleen no tenderness no masses GU bladder not distended there is no CVA tenderness. Extremities; peripheral pulses are palpable. She has changes of mild chronic venous insufficiency with lower extremity edema. Musculoskeletal; she has virtually no ligamentous stability of either knee especially medially there is no effusion. Hip range of motion is normal. Neurologic; cranial nerves; visual fields are intact extraocular movements are normal cranial nerves V and VII, 9, 10, 12 appear normal. Mild left pronator drift. Apprising latest strength in her legs 4-4+ out of 5 equal bilaterally. There is no sensory loss. Reflexes are absent at the knees and ankle jerks brachial radialis. She is 2+ biceps and 2+ triceps Gait; with difficulty the patient is able to bring herself almost to a standing position. Minor assist of one person. She has tremendous valgus deformities of the knees compatible with her severe ligamentous damage. She is Aricept the hips. Trendelenburg gait Mental status; I detected no obvious abnormality she is not depressed or delirious some of her answers were a bit vague. I cannot rule out mild cognitive loss  Impression/plan  1. ?right hemisphere TIA. Left leg weakness is resolved. Hemoglobin A1c 6.1 LDL 95 2-D echo showed no source of embolism, mild pulmonary hypertension. 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 She is severely valgus of both knees, varus at the hips with Trendelenburg. This will take aggressive physical and  occupational therapy. I don't really know her functional status prior to hospitalization 3 declining hemoglobin leaving the hospital at 11.4, 11.2 yesterday. She was over 15 on arrival to hospital. She is on aspirin. She will need stool guaiacs 4 hyperlipidemia on statin.

## 2015-12-19 ENCOUNTER — Other Ambulatory Visit: Payer: Self-pay

## 2015-12-19 MED ORDER — HYDROCODONE-ACETAMINOPHEN 5-325 MG PO TABS: ORAL_TABLET | ORAL | Status: DC

## 2015-12-19 NOTE — Telephone Encounter (Signed)
RX faxed to [facility] @ [phone]. Phone number [phone]  

## 2015-12-24 ENCOUNTER — Encounter (HOSPITAL_COMMUNITY)
Admission: RE | Admit: 2015-12-24 | Discharge: 2015-12-24 | Disposition: A | Discharge status: Home / Self Care | Payer: Medicare Other | Source: Skilled Nursing Facility | Attending: Internal Medicine | Admitting: Internal Medicine

## 2015-12-24 LAB — CBC WITH DIFFERENTIAL/PLATELET
BASOS ABS: 0 10*3/uL (ref 0.0–0.1)
Basophils Relative: 0 %
Eosinophils Absolute: 0.2 10*3/uL (ref 0.0–0.7)
Eosinophils Relative: 3 %
HEMATOCRIT: 31.7 % — AB (ref 36.0–46.0)
HEMOGLOBIN: 10.3 g/dL — AB (ref 12.0–15.0)
LYMPHS PCT: 43 %
Lymphs Abs: 2.2 10*3/uL (ref 0.7–4.0)
MCH: 31.3 pg (ref 26.0–34.0)
MCHC: 32.5 g/dL (ref 30.0–36.0)
MCV: 96.4 fL (ref 78.0–100.0)
MONOS PCT: 7 %
Monocytes Absolute: 0.4 10*3/uL (ref 0.1–1.0)
NEUTROS ABS: 2.4 10*3/uL (ref 1.7–7.7)
Neutrophils Relative %: 47 %
Platelets: 252 10*3/uL (ref 150–400)
RBC: 3.29 MIL/uL — AB (ref 3.87–5.11)
RDW: 14 % (ref 11.5–15.5)
WBC: 5.2 10*3/uL (ref 4.0–10.5)

## 2015-12-24 LAB — OCCULT BLOOD X 1 CARD TO LAB, STOOL: Fecal Occult Bld: NEGATIVE

## 2015-12-27 ENCOUNTER — Encounter: Payer: Self-pay | Admitting: Internal Medicine

## 2015-12-27 NOTE — Progress Notes (Signed)
Chart opened in [facility]

## 2015-12-31 ENCOUNTER — Encounter (HOSPITAL_COMMUNITY)
Admission: RE | Admit: 2015-12-31 | Discharge: 2015-12-31 | Disposition: A | Discharge status: Home / Self Care | Payer: Medicare Other | Source: Skilled Nursing Facility | Attending: *Deleted | Admitting: *Deleted

## 2015-12-31 LAB — CBC WITH DIFFERENTIAL/PLATELET
Basophils Absolute: 0 10*3/uL (ref 0.0–0.1)
Basophils Relative: 1 %
Eosinophils Absolute: 0.2 10*3/uL (ref 0.0–0.7)
Eosinophils Relative: 4 %
HEMATOCRIT: 33.6 % — AB (ref 36.0–46.0)
Hemoglobin: 10.9 g/dL — ABNORMAL LOW (ref 12.0–15.0)
LYMPHS PCT: 43 %
Lymphs Abs: 2.4 10*3/uL (ref 0.7–4.0)
MCH: 31.5 pg (ref 26.0–34.0)
MCHC: 32.4 g/dL (ref 30.0–36.0)
MCV: 97.1 fL (ref 78.0–100.0)
MONO ABS: 0.6 10*3/uL (ref 0.1–1.0)
MONOS PCT: 10 %
NEUTROS ABS: 2.3 10*3/uL (ref 1.7–7.7)
Neutrophils Relative %: 42 %
Platelets: 220 10*3/uL (ref 150–400)
RBC: 3.46 MIL/uL — ABNORMAL LOW (ref 3.87–5.11)
RDW: 13.9 % (ref 11.5–15.5)
WBC: 5.5 10*3/uL (ref 4.0–10.5)

## 2016-01-11 ENCOUNTER — Encounter: Payer: Self-pay | Admitting: Internal Medicine

## 2016-01-11 ENCOUNTER — Encounter (HOSPITAL_COMMUNITY)
Admission: RE | Admit: 2016-01-11 | Discharge: 2016-01-11 | Disposition: A | Discharge status: Home / Self Care | Payer: Medicare Other | Source: Other Acute Inpatient Hospital | Attending: Internal Medicine | Admitting: Internal Medicine

## 2016-01-11 ENCOUNTER — Non-Acute Institutional Stay (SKILLED_NURSING_FACILITY): Payer: Medicare Other | Admitting: Internal Medicine

## 2016-01-11 DIAGNOSIS — M6281 Muscle weakness (generalized): Secondary | ICD-10-CM | POA: Insufficient documentation

## 2016-01-11 DIAGNOSIS — A079 Protozoal intestinal disease, unspecified: Secondary | ICD-10-CM | POA: Insufficient documentation

## 2016-01-11 DIAGNOSIS — G458 Other transient cerebral ischemic attacks and related syndromes: Secondary | ICD-10-CM

## 2016-01-11 DIAGNOSIS — D509 Iron deficiency anemia, unspecified: Secondary | ICD-10-CM

## 2016-01-11 DIAGNOSIS — R609 Edema, unspecified: Secondary | ICD-10-CM

## 2016-01-11 DIAGNOSIS — I10 Essential (primary) hypertension: Secondary | ICD-10-CM | POA: Diagnosis not present

## 2016-01-11 LAB — COMPREHENSIVE METABOLIC PANEL
ALK PHOS: 316 U/L — AB (ref 38–126)
ALT: 30 U/L (ref 14–54)
AST: 30 U/L (ref 15–41)
Albumin: 3.8 g/dL (ref 3.5–5.0)
Anion gap: 9 (ref 5–15)
BUN: 32 mg/dL — ABNORMAL HIGH (ref 6–20)
CALCIUM: 9 mg/dL (ref 8.9–10.3)
CHLORIDE: 103 mmol/L (ref 101–111)
CO2: 24 mmol/L (ref 22–32)
CREATININE: 1.1 mg/dL — AB (ref 0.44–1.00)
GFR, EST AFRICAN AMERICAN: 49 mL/min — AB (ref >60)
GFR, EST NON AFRICAN AMERICAN: 42 mL/min — AB (ref >60)
Glucose, Bld: 136 mg/dL — ABNORMAL HIGH (ref 65–99)
Potassium: 3.9 mmol/L (ref 3.5–5.1)
Sodium: 136 mmol/L (ref 135–145)
TOTAL PROTEIN: 7.6 g/dL (ref 6.5–8.1)
Total Bilirubin: 0.4 mg/dL (ref 0.3–1.2)

## 2016-01-11 LAB — CBC WITH DIFFERENTIAL/PLATELET
BASOS ABS: 0 10*3/uL (ref 0.0–0.1)
BASOS PCT: 0 %
EOS PCT: 2 %
Eosinophils Absolute: 0.2 10*3/uL (ref 0.0–0.7)
HCT: 35.9 % — ABNORMAL LOW (ref 36.0–46.0)
Hemoglobin: 12 g/dL (ref 12.0–15.0)
Lymphocytes Relative: 35 %
Lymphs Abs: 2.4 10*3/uL (ref 0.7–4.0)
MCH: 32.9 pg (ref 26.0–34.0)
MCHC: 33.4 g/dL (ref 30.0–36.0)
MCV: 98.4 fL (ref 78.0–100.0)
MONO ABS: 0.5 10*3/uL (ref 0.1–1.0)
Monocytes Relative: 7 %
Neutro Abs: 3.7 10*3/uL (ref 1.7–7.7)
Neutrophils Relative %: 56 %
PLATELETS: 199 10*3/uL (ref 150–400)
RBC: 3.65 MIL/uL — AB (ref 3.87–5.11)
RDW: 13.7 % (ref 11.5–15.5)
WBC: 6.7 10*3/uL (ref 4.0–10.5)

## 2016-01-11 LAB — OCCULT BLOOD X 1 CARD TO LAB, STOOL: FECAL OCCULT BLD: NEGATIVE

## 2016-01-11 LAB — BRAIN NATRIURETIC PEPTIDE: B NATRIURETIC PEPTIDE 5: 94 pg/mL (ref 0.0–100.0)

## 2016-01-11 NOTE — Progress Notes (Signed)
Patient ID: [name], female   DOB: 09-25-1922, 82 y.o.   MRN: [id number]  Location:  [facility]   Place of Service:  SNF (31)   [name], MD  Patient Care Team: [name], MD as PCP - General (Internal Medicine) Daneil Dolin, MD (Gastroenterology)  Extended Emergency Contact Information Primary Emergency Contact: [place], [place] [address] [address] [place] Phone: [phone] Mobile Phone: [phone] Relation: Niece Secondary Emergency Contact: Irene Shipper [place] of [place] Phone: [phone] Relation: Niece  Goals of care: Advanced Directive information Advanced Directives 01/11/2016  Does patient have an advance directive? Yes  Type of Advance Directive Living will  Does patient want to make changes to advanced directive? No - Patient declined  Copy of advanced directive(s) in chart? Yes    This is a routine-acute visit.  Chief complaint-acute visit secondary to lower extremity edema-medical management of chronic issues including history of TIA-anemia-diastolic CHF grade 1-hyperlipidemia   HPI:  Pt is a 82 y.o. female seen today for an acute visit for further leg edema-also follow-up of her chronic medical conditions as noted above.  Patient appears to be stable has gained strength urine her stay here apparently.  Initially was admitted to the hospital with left leg weakness however this quickly resolved MRI did not show any acute stroke-it was thought she had a right brain TIA.  She was started on aspirin.  Appears to be doing relatively well in this regards.  She also has a history of somewhat abated alkaline phosphatase back in March was 193.1 platelets 251.  She is on a statin low-dose atorvastatin 10 mg.  Most acute issue apparently as some chronic lower extremity edema that waxes and wanes-patient tells me at times she will take Lasix for a day or 2 if it increases  significantly.  Nursing staff has noted apparently some increased edema here lately.  Per chart review I do see she does have a history of grade 1 diastolic CHF.  This is complicated somewhat with a history of renal insufficiency most recent creatinine 1.11 back on March 7.  Currently she has no complaints of headache dizziness increased weakness chest pain or shortness of breath.  Her weight is 145.6 this appears to have been stable here for the past 3 weeks.  Dr. Dellia Nims also did order occult blood testing secondary to anemia apparently hemoglobin previously been in the 15 range and dropped to around the 11 range during hospitalization so far occult blood testing has been negative     Past Medical History  Diagnosis Date  . Memory loss   . Leukopenia   . Hyponatremia   . Other and unspecified hyperlipidemia   . Proteinuria   . Edema   . Unspecified asthma, with exacerbation   . Arthropathy, unspecified, site unspecified   . Unspecified essential hypertension   . S/P colonoscopy Jan 2012    anal papilla, internal hemorrhoids, melanosis coli  . Hyperlipidemia    Past Surgical History  Procedure Laterality Date  . None    . Esophagogastroduodenoscopy  06/16/2011    Dr. Oneida Alar: moderate gastritis, no h.pylori  . Colonoscopy  11/05/2010    Dr. Gala Romney: Anal papilla and internal hemorrhoids pigmented rectal mucosa consistent with melanosis coli/ Diffuse pigmentation of the colonic mucosa consistent with melanosis coli, localized cecal diverticula as described above   Social history-patient apparently had been living with her niece for the past year-no  significant history of smoking tobacco alcohol or illicit drug use. Apparently she owns her own home and used a walker at home.  Family history is negative for colon cancer   Family history negative No Known Allergies  Medications.  Tylenol 650 mg every 6 hours when necessary.  Norvasc 5 mg daily.  Aspirin 81 mg  daily.  Atorvastatin 10 mg daily.  Iron 325 mg twice a day.  Lasix 40 mg daily when necessary edema.  And Vicodin 02/06/2024 milligrams every 6 hours when necessary.  Prilosec 20 mg daily.  Alltace  10 mg daily.    Review of Systems   In general no complaints of fever or chills.  Skin is not complaining rashes or itching.  Head ears eyes nose mouth and throat does not complaining of any visual changes or sore throat.  Respiratory no complaints of shortness of breath or increased cough.  Cardiac no chest pain has lower extremity edema.  GI does not complain of nausea vomiting diarrhea constipation or abdominal discomfort.  GU no complaints of dysuria.  Muscle skeletal continues to have some weakness but apparently this has improved somewhat he is not complaining of joint pain.   Neurologic is not complaining of dizziness headache or syncopal-type feelings.  In psych no complaints of depression or anxiety  Immunization History  Administered Date(s) Administered  . Influenza Whole 07/12/2007, 07/12/2008  . PPD Test 12/26/2015   Pertinent  Health Maintenance Due  Topic Date Due  . PNA vac Low Risk Adult (1 of 2 - PCV13) 10/06/1988  . INFLUENZA VACCINE  05/06/2016  . DEXA SCAN  Completed   No flowsheet data found. Functional Status Survey:    Filed Vitals:   01/11/16 1623  BP: 130/68  Pulse: 84  Temp: 98 F (36.7 C)  TempSrc: Oral  Resp: 18  Height: '5\' 5"'  (1.651 m)  Weight: 147 lb 3.2 oz (66.769 kg)   Body mass index is 24.5 kg/(m^2). Physical Exam In general this a pleasant elderly female in no distress sitting comfortably in her wheelchair.  Her skin is warm and dry.  Eyes pupils appear reactive to light sclera and conjunctiva are clear visual acuity appears grossly intact.  Oropharynx clear mucous membranes moist.  Chest is clear to auscultation there is no labored breathing.  Heart is regular rate and rhythm without murmur gallop or rub  which a she has 1-2 plus lower extremity edema bilaterally this is cool to touch nonerythematous nontender--suspect her some venous stasis changes here as well.  Abdomen is soft nontender with positive bowel sounds.  Muscle skeletal ambulating in a wheelchair moves upper extremities at baseline has some lower extremity weakness with gait instability as previously noted by Dr. Dellia Nims.  Neurologic again grossly intact I do not see lateralizing findings.  Cranial nerves are grossly intact speech is clear.  In psych she appears grossly alert and oriented pleasant and appropriate Labs reviewed:  12/31/2015.  WBC 5.5 hemoglobin 10.9 platelets 220  Recent Labs  12/09/15 1220 12/09/15 1239 12/11/15 0339 12/13/15 0725  NA 140 143 141 139  K 3.9 3.8 3.9 3.9  CL 109 108 109 108  CO2 22  --  23 26  GLUCOSE 129* 124* 105* 99  BUN 36* 35* 26* 23*  CREATININE 1.25* 1.20* 1.11* 0.92  CALCIUM 9.6  --  8.9 8.7*    Recent Labs  12/09/15 1220  AST 38  ALT 24  ALKPHOS 193*  BILITOT 0.6  PROT 7.8  ALBUMIN 3.8  Recent Labs  12/09/15 1220  12/13/15 0725 12/24/15 0840 12/31/15 0630  WBC 12.4*  < > 8.0 5.2 5.5  NEUTROABS 10.3*  --   --  2.4 2.3  HGB 13.2  < > 11.2* 10.3* 10.9*  HCT 39.3  < > 33.6* 31.7* 33.6*  MCV 93.3  < > 94.1 96.4 97.1  PLT 199  < > 191 252 220  < > = values in this interval not displayed. Lab Results  Component Value Date   TSH 0.311* 04/09/2011   Lab Results  Component Value Date   HGBA1C 6.1* 12/10/2015   Lab Results  Component Value Date   CHOL 175 12/10/2015   HDL 59 12/10/2015   LDLCALC 95 12/10/2015   TRIG 107 12/10/2015   CHOLHDL 3.0 12/10/2015       Assessment/Plan  #1 lower extremity edema-apparently this comes and goes-suspect there is some venous stasis here as well-would like to update her kidney function and electrolytes before starting any Lasix a we have up-to-date values-also will order a BNP-clinically she appears stable  with no increased shortness of breath-I do note she has a history of diastolic CHF grade 1. It appears weights have been stable relatively last 3 weeks will order weight tomorrow notify provider of any significant weight gain  #2 history of TIA-she is on aspirin neurologically she appears to be stable at this point will monitor I  #3-#3 anemia-occult blood testing has been negative most recent hemoglobin 10.9 on March 27 will update this.--I note she is on iron  #4-history hyperlipidemia she is on a statin 9 no alkaline phosphatase has been somewhat elevated we will update liver function tests.  #5 hypertension-at this point appears variable  most recently 130/68 -144/87 this will have to be monitored she is on Norvasc 5 mg daily as well as ramipril 10 mg a day.--I suspect  If  we start Lasix s may have a beneficial effect  on blood pressure  #6 history of chronic kidney disease again most recent creatinine 1.11 back on March 7 will update this  Update.  We have obtained stat blood work that shows an improved hemoglobin of 12.0.  Metabolic panel appears essentially baseline with a sodium 136 potassium 3.9 BUN 32 creatinine 1.1 CO2 level XXIV.  e BNP is fairly unremarkable at 94.  Alkaline phosphatase has risen somewhat up to 316 otherwise liver function tests within normal limits  I have spoken with nursing and will start low-dose Lasix for 3 days at 20 mg a day with 10 mEq of potassium as well --clinically she appears stable-apparently she has been on when necessary Lasix before-again will monitor the edema and weights and clinical status although at this point she appears to be doing actually quite well.  In regards to elevated alk phosphatase this appears to be rising will  temporarily hold her statin will discuss this with Dr. Linna Darner when he is in the facility  [id number]-of note greater than 40 minutes spent assessing patient-discussing her status with nursing  staff reviewing her  chart-reviewing her labs-as well as updated labs-and coordinating formulating a plan of care for numerous diagnoses-of note greater than 50% of time spent coordinating plan of care

## 2016-01-13 DIAGNOSIS — R609 Edema, unspecified: Secondary | ICD-10-CM | POA: Insufficient documentation

## 2016-01-13 DIAGNOSIS — D509 Iron deficiency anemia, unspecified: Secondary | ICD-10-CM | POA: Insufficient documentation

## 2016-01-13 NOTE — Progress Notes (Signed)
Patient ID: [name], female   DOB: August 02, 1922, 82 y.o.   MRN: [id number]

## 2016-01-14 ENCOUNTER — Encounter (HOSPITAL_COMMUNITY)
Admission: RE | Admit: 2016-01-14 | Discharge: 2016-01-14 | Disposition: A | Discharge status: Home / Self Care | Payer: Medicare Other | Source: Skilled Nursing Facility | Attending: *Deleted | Admitting: *Deleted

## 2016-01-14 LAB — BASIC METABOLIC PANEL
Anion gap: 9 (ref 5–15)
BUN: 30 mg/dL — ABNORMAL HIGH (ref 6–20)
CALCIUM: 9.4 mg/dL (ref 8.9–10.3)
CO2: 26 mmol/L (ref 22–32)
CREATININE: 0.91 mg/dL (ref 0.44–1.00)
Chloride: 107 mmol/L (ref 101–111)
GFR, EST NON AFRICAN AMERICAN: 53 mL/min — AB (ref >60)
Glucose, Bld: 87 mg/dL (ref 65–99)
Potassium: 4 mmol/L (ref 3.5–5.1)
SODIUM: 142 mmol/L (ref 135–145)

## 2016-01-14 LAB — ALKALINE PHOSPHATASE: ALK PHOS: 308 U/L — AB (ref 38–126)

## 2016-01-19 ENCOUNTER — Encounter (HOSPITAL_COMMUNITY)
Admission: RE | Admit: 2016-01-19 | Discharge: 2016-01-19 | Disposition: A | Discharge status: Home / Self Care | Payer: Medicare Other | Source: Skilled Nursing Facility | Attending: Internal Medicine | Admitting: Internal Medicine

## 2016-01-19 LAB — OCCULT BLOOD X 1 CARD TO LAB, STOOL: FECAL OCCULT BLD: NEGATIVE

## 2016-01-24 ENCOUNTER — Other Ambulatory Visit: Payer: Self-pay | Admitting: *Deleted

## 2016-01-24 MED ORDER — HYDROCODONE-ACETAMINOPHEN 5-325 MG PO TABS: ORAL_TABLET | ORAL | Status: DC

## 2016-02-05 DIAGNOSIS — H26493 Other secondary cataract, bilateral: Secondary | ICD-10-CM | POA: Diagnosis not present

## 2016-02-05 DIAGNOSIS — Z961 Presence of intraocular lens: Secondary | ICD-10-CM | POA: Diagnosis not present

## 2016-02-05 DIAGNOSIS — H524 Presbyopia: Secondary | ICD-10-CM | POA: Diagnosis not present

## 2016-02-05 DIAGNOSIS — H35023 Exudative retinopathy, bilateral: Secondary | ICD-10-CM | POA: Diagnosis not present

## 2016-02-07 ENCOUNTER — Non-Acute Institutional Stay (SKILLED_NURSING_FACILITY): Payer: Medicare Other | Admitting: Internal Medicine

## 2016-02-07 ENCOUNTER — Encounter: Payer: Self-pay | Admitting: Internal Medicine

## 2016-02-07 DIAGNOSIS — M179 Osteoarthritis of knee, unspecified: Secondary | ICD-10-CM

## 2016-02-07 DIAGNOSIS — I10 Essential (primary) hypertension: Secondary | ICD-10-CM

## 2016-02-07 DIAGNOSIS — D509 Iron deficiency anemia, unspecified: Secondary | ICD-10-CM

## 2016-02-07 DIAGNOSIS — IMO0002 Reserved for concepts with insufficient information to code with codable children: Secondary | ICD-10-CM

## 2016-02-07 DIAGNOSIS — Z8673 Personal history of transient ischemic attack (TIA), and cerebral infarction without residual deficits: Secondary | ICD-10-CM | POA: Diagnosis not present

## 2016-02-07 DIAGNOSIS — M1710 Unilateral primary osteoarthritis, unspecified knee: Secondary | ICD-10-CM

## 2016-02-07 NOTE — Progress Notes (Signed)
Location:  [place] of Service:  SNF (31)  FANTA,TESFAYE, MD  Patient Care Team: [name], MD as PCP - General (Internal Medicine) Daneil Dolin, MD (Gastroenterology)  Extended Emergency Contact Information Primary Emergency Contact: [address] [place], [place] [address] Johnnette Litter of [place] Phone: [phone] Mobile Phone: [phone] Relation: Niece Secondary Emergency Contact: Irene Shipper [place] of [place] Phone: [phone] Relation: Niece Goals of care: Advanced Directive information Advanced Directives 02/07/2016  Does patient have an advance directive? Yes  Type of Advance Directive Living will  Does patient want to make changes to advanced directive? No - Patient declined  Copy of advanced directive(s) in chart? Yes     Chief Complaint  Patient presents with  . Discharge Note    HPI:  Pt is a 82 y.o. female seen today fordischarge  ---Her stay here is been fairly unremarkable.  She lives in [place] with her niece.  She presented to the hospital with acute onset of left leg weakness.  She was given tPA-the left leg weakness resolved.  MRI did not show any acute stroke.  She was felt to have a right brain TIA without residual deficits.  She was started on aspirin for secondary stroke prevention.  She has done well it appears with therapy does not complain of any swallowing difficulties or visual changes.  She will be going home with her increase was very supportive.  She does have walkers at home.  Currently she has no acute complaints vital signs appear to be stable at times she does have somewhat elevated systolics but will defer any medication changes to primary care provider since she is about to be discharged.--She is on Norvasc 5 mg a day as well as Ramapril  Occult blood testing has been ordered and has been negative this was ordered secondary to dropping hemoglobin although hemoglobin  appears to be rebounding with a level of 12.0 on April 7.  She is on iron.    Past Medical History  Diagnosis Date  . Memory loss   . Leukopenia   . Hyponatremia   . Other and unspecified hyperlipidemia   . Proteinuria   . Edema   . Unspecified asthma, with exacerbation   . Arthropathy, unspecified, site unspecified   . Unspecified essential hypertension   . S/P colonoscopy Jan 2012    anal papilla, internal hemorrhoids, melanosis coli  . Hyperlipidemia    Past Surgical History  Procedure Laterality Date  . None    . Esophagogastroduodenoscopy  06/16/2011    Dr. Oneida Alar: moderate gastritis, no h.pylori  . Colonoscopy  11/05/2010    Dr. Gala Romney: Anal papilla and internal hemorrhoids pigmented rectal mucosa consistent with melanosis coli/ Diffuse pigmentation of the colonic mucosa consistent with melanosis coli, localized cecal diverticula as described above    No Known Allergies  Current Outpatient Prescriptions on File Prior to Visit  Medication Sig Dispense Refill  . acetaminophen (TYLENOL) 325 MG tablet Take 650 mg by mouth every 6 (six) hours as needed for mild pain.     Marland Kitchen amLODipine (NORVASC) 5 MG tablet Take 5 mg by mouth daily.    Marland Kitchen aspirin EC 81 MG EC tablet Take 1 tablet (81 mg total) by mouth daily. 30 tablet 2  . atorvastatin (LIPITOR) 10 MG tablet Take 1 tablet (10 mg total) by mouth daily at 6 PM. 30 tablet 2  . ramipril (ALTACE) 10 MG capsule Take  10 mg by mouth daily.     No current facility-administered medications on file prior to visit.     Review of Systems   In general is not complaining fever or chills says she feels well.  Skin does not complain of rashes or itching.  Head ears eyes nose mouth throat does not complain of any visual disturbances she does use prescription lenses.  Her story does not complain of shortness breath or cough  Cardiac does not complain of chest pain or palpitations.  GI no complaints of nausea vomiting diarrhea  constipation or abdominal pain.  GU does not complain of dysuria.  Muscle skeletal is not complaining of any joint pain.  Neurologicdenies any weakness numbness or supple-type feelings. Psychiatric is not complaining of depression or anxiety  Immunization History  Administered Date(s) Administered  . Influenza Whole 07/12/2007, 07/12/2008  . PPD Test 12/26/2015   Pertinent  Health Maintenance Due  Topic Date Due  . PNA vac Low Risk Adult (1 of 2 - PCV13) 10/06/1988  . INFLUENZA VACCINE  05/06/2016  . DEXA SCAN  Completed   No flowsheet data found. Functional Status Survey:    Filed Vitals:   02/07/16 1536  BP: 146/82  Pulse: 84  Temp: 97.8 F (36.6 C)  TempSrc: Oral  Resp: 20  Weight: 144 lb 9.6 oz (65.59 kg)   Body mass index is 24.06 kg/(m^2). Physical Exam   In general this is a pleasant elderly resident in no distress sitting comfortably in her wheelchair.  Her skin is warm and dry.  Oropharynx clear mucous membranes moist.  Chest is clear to auscultation there is no labored breathing.  Heart is regular rate and rhythm without murmur gallop or rub.  Abdomen is soft nontender with positive bowel sounds.  Muscle skeletal does move all extremities 4 she has chronic venous stasis changes with lower extremity edema-currently has compression hose on. She has history of valgus of both knees varus at the hips-she would benefit from continued PT and OT  She is able to use a walker-.  Neurologic Cranial nerves are grossly intact visual acuity appears grossly intact strength of her legs appears equal bilaterally.  Psych she is alert and oriented very pleasant and appropriate Labs reviewed:  Recent Labs  12/13/15 0725 01/11/16 1900 01/14/16 0600  NA 139 136 142  K 3.9 3.9 4.0  CL 108 103 107  CO2 '26 24 26  ' GLUCOSE 99 136* 87  BUN 23* 32* 30*  CREATININE 0.92 1.10* 0.91  CALCIUM 8.7* 9.0 9.4    Recent Labs  12/09/15 1220 01/11/16 1900  01/14/16 0600  AST 38 30  --   ALT 24 30  --   ALKPHOS 193* 316* 308*  BILITOT 0.6 0.4  --   PROT 7.8 7.6  --   ALBUMIN 3.8 3.8  --     Recent Labs  12/24/15 0840 12/31/15 0630 01/11/16 1900  WBC 5.2 5.5 6.7  NEUTROABS 2.4 2.3 3.7  HGB 10.3* 10.9* 12.0  HCT 31.7* 33.6* 35.9*  MCV 96.4 97.1 98.4  PLT 252 220 199   Lab Results  Component Value Date   TSH 0.311* 04/09/2011   Lab Results  Component Value Date   HGBA1C 6.1* 12/10/2015   Lab Results  Component Value Date   CHOL 175 12/10/2015   HDL 59 12/10/2015   LDLCALC 95 12/10/2015   TRIG 107 12/10/2015   CHOLHDL 3.0 12/10/2015      Assessment/Plan  History of TIA-again  she is on aspirin as well as a statin-clinically appears stable has worked with therapy-I feel she would benefit from continued therapy with her orthopedic issues as noted above clinically appears stable she will be with her niece has walkers at home.  #2 history of declining hemoglobin this appears to have bounced back somewhat at 12.0 almost recent lab in early April will check a CBC this will need follow-up by primary care provider stool guaiacs have been negative.--She is on iron  #3 history of hyperlipidemia she is on a statin since her stay here was quite short was not aggressive pursuing lipid follow-up.  #4-history of hypertension-has somewhat variable blood pressures 146/80-130/78-124/92-will defer follow-up to primary care provider she is on Norvasc 5 mg a day and all taste 10 mg a day 5 history of edema with apparent history of grade 1 diastolic CHF her weight appears to be relatively stable at 144.2 will defer follow-up to primary care provider clinically appears to be stable.  #6 history of elevated alk phosphatase most recent alk phosphatase was 308 this appears to be trending down Will warrant follow up by primary care provider will update a CMP  Clinically patient appears to be doing well again will need expedient follow-up by  primary care provider and neurology as warranted.  [id number] note greater than 30 minutes spent in discharge summary-greater than 50% of time spent coordinating plan of care for numerous diagnoses

## 2016-02-08 ENCOUNTER — Encounter (HOSPITAL_COMMUNITY)
Admission: RE | Admit: 2016-02-08 | Discharge: 2016-02-08 | Disposition: A | Discharge status: Home / Self Care | Payer: Medicare Other | Source: Skilled Nursing Facility | Attending: Internal Medicine | Admitting: Internal Medicine

## 2016-02-08 DIAGNOSIS — K5909 Other constipation: Secondary | ICD-10-CM | POA: Diagnosis not present

## 2016-02-08 DIAGNOSIS — R262 Difficulty in walking, not elsewhere classified: Secondary | ICD-10-CM | POA: Insufficient documentation

## 2016-02-08 DIAGNOSIS — N189 Chronic kidney disease, unspecified: Secondary | ICD-10-CM | POA: Diagnosis not present

## 2016-02-08 DIAGNOSIS — I10 Essential (primary) hypertension: Secondary | ICD-10-CM | POA: Diagnosis not present

## 2016-02-08 DIAGNOSIS — M6281 Muscle weakness (generalized): Secondary | ICD-10-CM | POA: Diagnosis not present

## 2016-02-08 DIAGNOSIS — K2960 Other gastritis without bleeding: Secondary | ICD-10-CM | POA: Diagnosis not present

## 2016-02-08 DIAGNOSIS — I639 Cerebral infarction, unspecified: Secondary | ICD-10-CM | POA: Insufficient documentation

## 2016-02-08 LAB — COMPREHENSIVE METABOLIC PANEL
ALT: 17 U/L (ref 14–54)
AST: 20 U/L (ref 15–41)
Albumin: 3.3 g/dL — ABNORMAL LOW (ref 3.5–5.0)
Alkaline Phosphatase: 274 U/L — ABNORMAL HIGH (ref 38–126)
Anion gap: 7 (ref 5–15)
BUN: 28 mg/dL — AB (ref 6–20)
CHLORIDE: 106 mmol/L (ref 101–111)
CO2: 27 mmol/L (ref 22–32)
CREATININE: 0.9 mg/dL (ref 0.44–1.00)
Calcium: 9.6 mg/dL (ref 8.9–10.3)
GFR calc Af Amer: >60 mL/min (ref >60)
GFR calc non Af Amer: 54 mL/min — ABNORMAL LOW (ref >60)
GLUCOSE: 95 mg/dL (ref 65–99)
Potassium: 3.9 mmol/L (ref 3.5–5.1)
SODIUM: 140 mmol/L (ref 135–145)
Total Bilirubin: 0.5 mg/dL (ref 0.3–1.2)
Total Protein: 6.9 g/dL (ref 6.5–8.1)

## 2016-02-08 LAB — CBC WITH DIFFERENTIAL/PLATELET
BASOS ABS: 0 10*3/uL (ref 0.0–0.1)
Basophils Relative: 0 %
EOS ABS: 0.1 10*3/uL (ref 0.0–0.7)
EOS PCT: 3 %
HCT: 39.3 % (ref 36.0–46.0)
Hemoglobin: 12.8 g/dL (ref 12.0–15.0)
LYMPHS PCT: 45 %
Lymphs Abs: 2.3 10*3/uL (ref 0.7–4.0)
MCH: 31.7 pg (ref 26.0–34.0)
MCHC: 32.6 g/dL (ref 30.0–36.0)
MCV: 97.3 fL (ref 78.0–100.0)
Monocytes Absolute: 0.4 10*3/uL (ref 0.1–1.0)
Monocytes Relative: 8 %
NEUTROS PCT: 44 %
Neutro Abs: 2.3 10*3/uL (ref 1.7–7.7)
PLATELETS: 198 10*3/uL (ref 150–400)
RBC: 4.04 MIL/uL (ref 3.87–5.11)
RDW: 13.2 % (ref 11.5–15.5)
WBC: 5.2 10*3/uL (ref 4.0–10.5)

## 2016-02-10 NOTE — Progress Notes (Signed)
Patient ID: [name], female   DOB: August 02, 1922, 82 y.o.   MRN: [id number]

## 2016-02-12 ENCOUNTER — Ambulatory Visit: Payer: Medicare Other | Admitting: Nurse Practitioner

## 2016-02-12 DIAGNOSIS — R2689 Other abnormalities of gait and mobility: Secondary | ICD-10-CM | POA: Diagnosis not present

## 2016-02-12 DIAGNOSIS — M6281 Muscle weakness (generalized): Secondary | ICD-10-CM | POA: Diagnosis not present

## 2016-02-13 ENCOUNTER — Ambulatory Visit (INDEPENDENT_AMBULATORY_CARE_PROVIDER_SITE_OTHER): Payer: Medicare Other | Admitting: Nurse Practitioner

## 2016-02-13 ENCOUNTER — Encounter: Payer: Self-pay | Admitting: Nurse Practitioner

## 2016-02-13 VITALS — BP 135/80 | HR 68 | Ht 65.0 in | Wt 155.0 lb

## 2016-02-13 DIAGNOSIS — G458 Other transient cerebral ischemic attacks and related syndromes: Secondary | ICD-10-CM | POA: Diagnosis not present

## 2016-02-13 DIAGNOSIS — I10 Essential (primary) hypertension: Secondary | ICD-10-CM

## 2016-02-13 DIAGNOSIS — E785 Hyperlipidemia, unspecified: Secondary | ICD-10-CM

## 2016-02-13 DIAGNOSIS — M6281 Muscle weakness (generalized): Secondary | ICD-10-CM | POA: Diagnosis not present

## 2016-02-13 DIAGNOSIS — R2689 Other abnormalities of gait and mobility: Secondary | ICD-10-CM | POA: Diagnosis not present

## 2016-02-13 DIAGNOSIS — I639 Cerebral infarction, unspecified: Secondary | ICD-10-CM | POA: Diagnosis not present

## 2016-02-13 NOTE — Progress Notes (Addendum)
[facility]  PATIENT: [name] DOB: [birth year]   REASON FOR VISIT: Hospital follow-up for TIA HISTORY FROM:patient and power of attorney [place] Holzer is an 82 y.o. female patient who was taken to the [facility] emergency room via EMS for evaluation of acute onset of leg weakness on the left. Patient unable to provide history clearly. Per discussion with Dr. Sabra Heck who called for transfer the patient to [facility], Patient developed acute left leg weakness this morning 12/09/2015 at 10:10 AM (LKW). She continued to have progressive weakness in left lower extremity when she went to the church. She was evaluated in the [place] and was noted to be a candidate for IV TPA given her acute onset of left leg weakness. After completing IV TPA infusion, as he does not have a neurologic coverage over this weekend, Dr. Sabra Heck called for transfer the patient to [facility] for continued Post TPA neurological monitoring. She has been transferred directly to the neuro ICU at the [facility]. She reported resolution of the left leg weakness at this time. She denied any other new neurological symptoms.MRI without acute stroke was felt to be a right brain TIA without residual effects She was not on aspirin prior to admission. MRA without median or large vessel stenosis. Carotid Doppler no significant stenosis. 2-D echo no source of emboli LDL 95 and she was placed on Lipitor 10 mg daily. Hemoglobin A1c 6.1. She was sent to a skilled nursing facility for rehabilitation. She returns for follow-up today without further stroke or TIA symptoms. She is back at home now. REVIEW OF SYSTEMS: Full 14 system review of systems performed and notable only for those listed, all others are neg:  Constitutional: neg  Cardiovascular: neg Ear/Nose/Throat: hearing loss Skin: neg Eyes: neg Respiratory: neg Gastroitestinal: neg    Hematology/Lymphatic: neg  Endocrine: neg Musculoskeletal:gait abnormality Allergy/Immunology: neg Neurological: neg Psychiatric: neg Sleep : neg   ALLERGIES: No Known Allergies  HOME MEDICATIONS: Outpatient Prescriptions Prior to Visit  Medication Sig Dispense Refill  . acetaminophen (TYLENOL) 325 MG tablet Take 650 mg by mouth every 6 (six) hours as needed for mild pain.     Marland Kitchen amLODipine (NORVASC) 5 MG tablet Take 5 mg by mouth daily.    Marland Kitchen aspirin EC 81 MG EC tablet Take 1 tablet (81 mg total) by mouth daily. 30 tablet 2  . atorvastatin (LIPITOR) 10 MG tablet Take 1 tablet (10 mg total) by mouth daily at 6 PM. 30 tablet 2  . ferrous sulfate 325 (65 FE) MG tablet Take 325 mg by mouth 2 (two) times daily with a meal.    . HYDROcodone-acetaminophen (NORCO/VICODIN) 5-325 MG tablet Take 1 tablet by mouth every 6 (six) hours as needed for moderate pain.    Marland Kitchen omeprazole (PRILOSEC) 20 MG capsule Take 20 mg by mouth daily.    . ramipril (ALTACE) 10 MG capsule Take 10 mg by mouth daily.     No facility-administered medications prior to visit.    PAST MEDICAL HISTORY: Past Medical History  Diagnosis Date  . Memory loss   . Leukopenia   . Hyponatremia   . Other and unspecified hyperlipidemia   . Proteinuria   . Edema   . Unspecified asthma, with exacerbation   . Arthropathy, unspecified, site unspecified   . Unspecified essential hypertension   . S/P colonoscopy Jan 2012    anal papilla, internal hemorrhoids, melanosis coli  .  Hyperlipidemia   . Stroke [facility])     PAST SURGICAL HISTORY: Past Surgical History  Procedure Laterality Date  . None    . Esophagogastroduodenoscopy  06/16/2011    Dr. Oneida Alar: moderate gastritis, no h.pylori  . Colonoscopy  11/05/2010    Dr. Gala Romney: Anal papilla and internal hemorrhoids pigmented rectal mucosa consistent with melanosis coli/ Diffuse pigmentation of the colonic mucosa consistent with melanosis coli, localized cecal diverticula as  described above    FAMILY HISTORY: Family History  Problem Relation Age of Onset  . Colon cancer Neg Hx   . Heart disease    . Arthritis      SOCIAL HISTORY: Social History   Social History  . Marital Status: Single    Spouse Name: N/A  . Number of Children: 0  . Years of Education: college   Occupational History  .     Social History Main Topics  . Smoking status: Former Smoker    Types: Cigarettes  . Smokeless tobacco: Never Used  . Alcohol Use: No  . Drug Use: No  . Sexual Activity: Not on file   Other Topics Concern  . Not on file   Social History Narrative   Lives with niece, [place], [place]:   02/13/16 1518  BP: 135/80  Pulse: 68  Height: 5\' 5"  (1.651 m)  Weight: 155 lb (70.308 kg)   Body mass index is 25.79 kg/(m^2).  Generalized: Well developed, in no acute distress  Head: normocephalic and atraumatic,. Oropharynx benign  Neck: Supple, no carotid bruits  Cardiac: Regular rate rhythm, no murmur  Musculoskeletal: No deformity   Neurological examination   Mentation: Alert oriented to time, place, history taking. Attention span and concentration appropriate. Recent and remote memory intact.  Follows all commands speech and language fluent.   Cranial nerve II-XII: Pupils irregular but reactive,  extraocular movements were full, visual field were full on confrontational test. Facial sensation and strength were normal. hearing was intact to finger rubbing bilaterally. Uvula tongue midline. head turning and shoulder shrug were normal and symmetric.Tongue protrusion into cheek strength was normal. Motor: normal bulk and tone, full strength in the BUE, BLE, fine finger movements slow, no pronator drift. Right foot drop with ankle dorsiflexion weakness which is chronic Sensory: normal and symmetric to light touch, pinprick, and  Vibration, proprioception  Coordination: finger-nose-finger, no dysmetria Reflexes: symmetric  upper and lower, plantar responses were flexor bilaterally. Gait and Station: in wheelchair not ambulated DIAGNOSTIC DATA (LABS, IMAGING, TESTING) - I reviewed patient records, labs, notes, testing and imaging myself where available.  Lab Results  Component Value Date   WBC 5.2 02/08/2016   HGB 12.8 02/08/2016   HCT 39.3 02/08/2016   MCV 97.3 02/08/2016   PLT 198 02/08/2016      Component Value Date/Time   NA 140 02/08/2016 0705   K 3.9 02/08/2016 0705   CL 106 02/08/2016 0705   CO2 27 02/08/2016 0705   GLUCOSE 95 02/08/2016 0705   BUN 28* 02/08/2016 0705   CREATININE 0.90 02/08/2016 0705   CREATININE 0.92 06/20/2011 1200   CALCIUM 9.6 02/08/2016 0705   PROT 6.9 02/08/2016 0705   ALBUMIN 3.3* 02/08/2016 0705   AST 20 02/08/2016 0705   ALT 17 02/08/2016 0705   ALKPHOS 274* 02/08/2016 0705   BILITOT 0.5 02/08/2016 0705   GFRNONAA 54* 02/08/2016 0705   GFRAA >60 02/08/2016 0705   Lab Results  Component  Value Date   CHOL 175 12/10/2015   HDL 59 12/10/2015   LDLCALC 95 12/10/2015   TRIG 107 12/10/2015   CHOLHDL 3.0 12/10/2015   Lab Results  Component Value Date   HGBA1C 6.1* 12/10/2015    ASSESSMENT AND PLAN  82 y.o. year old female  has a past medical history of Memory loss; Leukopenia; Hyponatremia; Other and unspecified hyperlipidemia;  Unspecified essential hypertension;  Hyperlipidemia; and Stroke ([place]). here to follow up for right brain TIA status post  TPA. The patient is a current patient of Dr. Leonie Man  who is out of the office today . This note is sent to the work in doctor.      PLAN: Stressed the importance of management of risk factors to prevent further stroke Continue aspirin for secondary stroke prevention Maintain strict control of hypertension with blood pressure goal below 130/90, today's reading 135/80 continue antihypertensive medications Cholesterol with LDL cholesterol less than 70, followed by primary care,  most recent 95  continue  Lipitor Exercise by walking with your walker, slowly increase , eat healthy diet with whole grains fresh fruits and vegetables Additional questions answered for power of attorney Follow-up in 4 monthsVst time 25 min Dennie Bible, [facility], [facility], [facility] [address] [place], [place] [address] [phone]  I reviewed the above note and documentation by the Nurse Practitioner and agree with the history, physical exam, assessment and plan as outlined above. I was immediately available for face-to-face consultation. Star Age, MD, PhD [facility])

## 2016-02-13 NOTE — Patient Instructions (Signed)
Stressed the importance of management of risk factors to prevent further stroke Continue aspirinfor secondary stroke prevention Maintain strict control of hypertension with blood pressure goal below 130/90, today's reading 135/80 continue antihypertensive medications Cholesterol with LDL cholesterol less than 70, followed by primary care,  most recent 95  continue Lipitor Exercise by walking, slowly increase , eat healthy diet with whole gr fresh fruits and vegetables Follow-up in 4 months

## 2016-02-14 DIAGNOSIS — R2689 Other abnormalities of gait and mobility: Secondary | ICD-10-CM | POA: Diagnosis not present

## 2016-02-14 DIAGNOSIS — M6281 Muscle weakness (generalized): Secondary | ICD-10-CM | POA: Diagnosis not present

## 2016-02-18 DIAGNOSIS — M6281 Muscle weakness (generalized): Secondary | ICD-10-CM | POA: Diagnosis not present

## 2016-02-18 DIAGNOSIS — R2689 Other abnormalities of gait and mobility: Secondary | ICD-10-CM | POA: Diagnosis not present

## 2016-02-19 DIAGNOSIS — M6281 Muscle weakness (generalized): Secondary | ICD-10-CM | POA: Diagnosis not present

## 2016-02-19 DIAGNOSIS — R2689 Other abnormalities of gait and mobility: Secondary | ICD-10-CM | POA: Diagnosis not present

## 2016-02-20 DIAGNOSIS — R2689 Other abnormalities of gait and mobility: Secondary | ICD-10-CM | POA: Diagnosis not present

## 2016-02-20 DIAGNOSIS — M6281 Muscle weakness (generalized): Secondary | ICD-10-CM | POA: Diagnosis not present

## 2016-02-21 DIAGNOSIS — G8190 Hemiplegia, unspecified affecting unspecified side: Secondary | ICD-10-CM | POA: Diagnosis not present

## 2016-02-21 DIAGNOSIS — I10 Essential (primary) hypertension: Secondary | ICD-10-CM | POA: Diagnosis not present

## 2016-02-21 DIAGNOSIS — R2681 Unsteadiness on feet: Secondary | ICD-10-CM | POA: Diagnosis not present

## 2016-02-21 DIAGNOSIS — E785 Hyperlipidemia, unspecified: Secondary | ICD-10-CM | POA: Diagnosis not present

## 2016-02-22 DIAGNOSIS — M6281 Muscle weakness (generalized): Secondary | ICD-10-CM | POA: Diagnosis not present

## 2016-02-22 DIAGNOSIS — R2689 Other abnormalities of gait and mobility: Secondary | ICD-10-CM | POA: Diagnosis not present

## 2016-02-26 DIAGNOSIS — R2689 Other abnormalities of gait and mobility: Secondary | ICD-10-CM | POA: Diagnosis not present

## 2016-02-26 DIAGNOSIS — M6281 Muscle weakness (generalized): Secondary | ICD-10-CM | POA: Diagnosis not present

## 2016-02-28 DIAGNOSIS — R2689 Other abnormalities of gait and mobility: Secondary | ICD-10-CM | POA: Diagnosis not present

## 2016-02-28 DIAGNOSIS — M6281 Muscle weakness (generalized): Secondary | ICD-10-CM | POA: Diagnosis not present

## 2016-03-04 DIAGNOSIS — R2689 Other abnormalities of gait and mobility: Secondary | ICD-10-CM | POA: Diagnosis not present

## 2016-03-04 DIAGNOSIS — M6281 Muscle weakness (generalized): Secondary | ICD-10-CM | POA: Diagnosis not present

## 2016-03-06 DIAGNOSIS — M6281 Muscle weakness (generalized): Secondary | ICD-10-CM | POA: Diagnosis not present

## 2016-03-06 DIAGNOSIS — R2689 Other abnormalities of gait and mobility: Secondary | ICD-10-CM | POA: Diagnosis not present

## 2016-03-10 DIAGNOSIS — M6281 Muscle weakness (generalized): Secondary | ICD-10-CM | POA: Diagnosis not present

## 2016-03-14 DIAGNOSIS — I739 Peripheral vascular disease, unspecified: Secondary | ICD-10-CM | POA: Diagnosis not present

## 2016-03-14 DIAGNOSIS — L851 Acquired keratosis [keratoderma] palmaris et plantaris: Secondary | ICD-10-CM | POA: Diagnosis not present

## 2016-03-14 DIAGNOSIS — B351 Tinea unguium: Secondary | ICD-10-CM | POA: Diagnosis not present

## 2016-03-24 DIAGNOSIS — Z0000 Encounter for general adult medical examination without abnormal findings: Secondary | ICD-10-CM | POA: Diagnosis not present

## 2016-03-24 DIAGNOSIS — D649 Anemia, unspecified: Secondary | ICD-10-CM | POA: Diagnosis not present

## 2016-03-24 DIAGNOSIS — I10 Essential (primary) hypertension: Secondary | ICD-10-CM | POA: Diagnosis not present

## 2016-03-24 DIAGNOSIS — F410 Panic disorder [episodic paroxysmal anxiety] without agoraphobia: Secondary | ICD-10-CM | POA: Diagnosis not present

## 2016-03-24 DIAGNOSIS — E785 Hyperlipidemia, unspecified: Secondary | ICD-10-CM | POA: Diagnosis not present

## 2016-03-24 DIAGNOSIS — R809 Proteinuria, unspecified: Secondary | ICD-10-CM | POA: Diagnosis not present

## 2016-03-24 DIAGNOSIS — M170 Bilateral primary osteoarthritis of knee: Secondary | ICD-10-CM | POA: Diagnosis not present

## 2016-03-24 DIAGNOSIS — E7800 Pure hypercholesterolemia, unspecified: Secondary | ICD-10-CM | POA: Diagnosis not present

## 2016-03-24 DIAGNOSIS — M1990 Unspecified osteoarthritis, unspecified site: Secondary | ICD-10-CM | POA: Diagnosis not present

## 2016-03-25 DIAGNOSIS — G8929 Other chronic pain: Secondary | ICD-10-CM | POA: Diagnosis not present

## 2016-03-25 DIAGNOSIS — M25561 Pain in right knee: Secondary | ICD-10-CM | POA: Diagnosis not present

## 2016-03-25 DIAGNOSIS — M1711 Unilateral primary osteoarthritis, right knee: Secondary | ICD-10-CM | POA: Diagnosis not present

## 2016-03-25 DIAGNOSIS — M1712 Unilateral primary osteoarthritis, left knee: Secondary | ICD-10-CM | POA: Diagnosis not present

## 2016-04-21 DIAGNOSIS — M170 Bilateral primary osteoarthritis of knee: Secondary | ICD-10-CM | POA: Diagnosis not present

## 2016-04-21 DIAGNOSIS — I10 Essential (primary) hypertension: Secondary | ICD-10-CM | POA: Diagnosis not present

## 2016-04-21 DIAGNOSIS — N183 Chronic kidney disease, stage 3 (moderate): Secondary | ICD-10-CM | POA: Diagnosis not present

## 2016-05-07 ENCOUNTER — Encounter: Payer: Self-pay | Admitting: Gastroenterology

## 2016-05-12 DIAGNOSIS — M1711 Unilateral primary osteoarthritis, right knee: Secondary | ICD-10-CM | POA: Diagnosis not present

## 2016-05-12 DIAGNOSIS — M1712 Unilateral primary osteoarthritis, left knee: Secondary | ICD-10-CM | POA: Diagnosis not present

## 2016-05-23 DIAGNOSIS — B351 Tinea unguium: Secondary | ICD-10-CM | POA: Diagnosis not present

## 2016-05-23 DIAGNOSIS — I739 Peripheral vascular disease, unspecified: Secondary | ICD-10-CM | POA: Diagnosis not present

## 2016-05-23 DIAGNOSIS — L851 Acquired keratosis [keratoderma] palmaris et plantaris: Secondary | ICD-10-CM | POA: Diagnosis not present

## 2016-06-02 DIAGNOSIS — M170 Bilateral primary osteoarthritis of knee: Secondary | ICD-10-CM | POA: Diagnosis not present

## 2016-06-02 DIAGNOSIS — G8929 Other chronic pain: Secondary | ICD-10-CM | POA: Diagnosis not present

## 2016-06-16 ENCOUNTER — Ambulatory Visit (INDEPENDENT_AMBULATORY_CARE_PROVIDER_SITE_OTHER): Payer: Medicare Other | Admitting: Nurse Practitioner

## 2016-06-16 ENCOUNTER — Encounter: Payer: Self-pay | Admitting: Nurse Practitioner

## 2016-06-16 VITALS — BP 138/78 | HR 60 | Ht 65.0 in | Wt 140.4 lb

## 2016-06-16 DIAGNOSIS — G458 Other transient cerebral ischemic attacks and related syndromes: Secondary | ICD-10-CM

## 2016-06-16 DIAGNOSIS — I639 Cerebral infarction, unspecified: Secondary | ICD-10-CM

## 2016-06-16 DIAGNOSIS — E785 Hyperlipidemia, unspecified: Secondary | ICD-10-CM | POA: Diagnosis not present

## 2016-06-16 NOTE — Patient Instructions (Signed)
Continue aspirin for secondary stroke prevention Maintain strict control of hypertension with blood pressure goal below 130/90, today's reading 138/78 continue antihypertensive medications Cholesterol with LDL cholesterol less than 70, followed by primary care,  continue Lipitor Exercise by walking with your walker, slowly increase , eat healthy diet with whole grains fresh fruits and vegetables Follow-up in 6 months

## 2016-06-16 NOTE — Progress Notes (Signed)
[facility]  PATIENT: [name] DOB: August 03, 1922   REASON FOR VISIT: Hospital follow-up for TIA HISTORY FROM:patient and power of attorney [place] 09/11/2017CM Ms. Piacentini 82 year old female returns for follow-up. 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 She is currently on aspirin without  recurrent stroke or TIA symptoms. Blood pressure well controlled in the office today 138/78. She remains on Lipitor without complaints of muscle aches or arthralgias. Appetite is good and she is sleeping well. She ambulates in her home with a  Walker. Her niece is her power of attorney. She returns for follow-up without further neurological symptoms  HISTORY 02/13/16 CMKatherine M [name] is an 82 y.o. female patient who was taken to the [facility] emergency room via EMS for evaluation of acute onset of leg weakness on the left. Patient unable to provide history clearly. Per discussion with Dr. Sabra Heck who called for transfer the patient to [facility], Patient developed acute left leg weakness this morning 12/09/2015 at 10:10 AM (LKW). She continued to have progressive weakness in left lower extremity when she went to the church. She was evaluated in the [place] and was noted to be a candidate for IV TPA given her acute onset of left leg weakness. After completing IV TPA infusion, as he does not have a neurologic coverage over this weekend, Dr. Sabra Heck called for transfer the patient to [facility] for continued Post TPA neurological monitoring. She has been transferred directly to the neuro ICU at the [facility]. She reported resolution of the left leg weakness at this time. She denied any other new neurological symptoms.MRI without acute stroke was felt to be a right brain TIA without residual effects She was not on aspirin prior to admission. MRA without median or large vessel stenosis. Carotid Doppler no  significant stenosis. 2-D echo no source of emboli LDL 95 and she was placed on Lipitor 10 mg daily. Hemoglobin A1c 6.1. She was sent to a skilled nursing facility for rehabilitation. She returns for follow-up today without further stroke or TIA symptoms. She is back at home now. REVIEW OF SYSTEMS: Full 14 system review of systems performed and notable only for those listed, all others are neg:  Constitutional: neg  Cardiovascular: neg Ear/Nose/Throat: hearing loss Skin: neg Eyes: neg Respiratory: neg Gastroitestinal: neg  Hematology/Lymphatic: neg  Endocrine: neg Musculoskeletal:gait abnormality Allergy/Immunology: neg Neurological: neg Psychiatric: neg Sleep : neg   ALLERGIES: No Known Allergies  HOME MEDICATIONS: Outpatient Medications Prior to Visit  Medication Sig Dispense Refill  . acetaminophen (TYLENOL) 325 MG tablet Take 650 mg by mouth every 6 (six) hours as needed for mild pain.     Marland Kitchen amLODipine (NORVASC) 5 MG tablet Take 5 mg by mouth daily.    Marland Kitchen aspirin EC 81 MG EC tablet Take 1 tablet (81 mg total) by mouth daily. 30 tablet 2  . atorvastatin (LIPITOR) 10 MG tablet Take 1 tablet (10 mg total) by mouth daily at 6 PM. 30 tablet 2  . ferrous sulfate 325 (65 FE) MG tablet Take 325 mg by mouth 2 (two) times daily with a meal.    . HYDROcodone-acetaminophen (NORCO/VICODIN) 5-325 MG tablet Take 1 tablet by mouth every 6 (six) hours as needed for moderate pain.    Marland Kitchen omeprazole (PRILOSEC) 20 MG capsule Take 20 mg by mouth daily.    . ramipril (ALTACE) 10 MG capsule Take 10 mg by mouth daily.  No facility-administered medications prior to visit.     PAST MEDICAL HISTORY: Past Medical History:  Diagnosis Date  . Arthropathy, unspecified, site unspecified   . Edema   . Hyperlipidemia   . Hyponatremia   . Leukopenia   . Memory loss   . Other and unspecified hyperlipidemia   . Proteinuria   . S/P colonoscopy Jan 2012   anal papilla, internal hemorrhoids, melanosis  coli  . Stroke ([place])   . Unspecified asthma, with exacerbation   . Unspecified essential hypertension     PAST SURGICAL HISTORY: Past Surgical History:  Procedure Laterality Date  . COLONOSCOPY  11/05/2010   Dr. Gala Romney: Anal papilla and internal hemorrhoids pigmented rectal mucosa consistent with melanosis coli/ Diffuse pigmentation of the colonic mucosa consistent with melanosis coli, localized cecal diverticula as described above  . ESOPHAGOGASTRODUODENOSCOPY  06/16/2011   Dr. Oneida Alar: moderate gastritis, no h.pylori  . None      FAMILY HISTORY: Family History  Problem Relation Age of Onset  . Colon cancer Neg Hx   . Heart disease    . Arthritis      SOCIAL HISTORY: Social History   Social History  . Marital status: Single    Spouse name: N/A  . Number of children: 0  . Years of education: college   Occupational History  .  Retired   Social History Main Topics  . Smoking status: Former Smoker    Types: Cigarettes  . Smokeless tobacco: Never Used  . Alcohol use No  . Drug use: No  . Sexual activity: Not on file   Other Topics Concern  . Not on file   Social History Narrative   Lives with niece, [place], Peggyann Juba        PHYSICAL EXAM  Vitals:   06/16/16 1510  BP: 138/78  Pulse: 60  Weight: 140 lb 6.4 oz (63.7 kg)  Height: 5\' 5"  (1.651 m)   Body mass index is 23.36 kg/m.  Generalized: Well developed, in no acute distress  Head: normocephalic and atraumatic,. Oropharynx benign  Neck: Supple, no carotid bruits  Cardiac: Regular rate rhythm, no murmur  Musculoskeletal: No deformity   Neurological examination   Mentation: Alert oriented to time, place, history taking. Attention span and concentration appropriate. Recent and remote memory intact.  Follows all commands speech and language fluent.   Cranial nerve II-XII: Pupils irregular but reactive,  extraocular movements were full, visual field were full on confrontational test. Facial sensation and  strength were normal. hearing was intact to finger rubbing bilaterally. Uvula tongue midline. head turning and shoulder shrug were normal and symmetric.Tongue protrusion into cheek strength was normal. Motor: normal bulk and tone, full strength in the BUE, BLE, fine finger movements slow, no pronator drift. Right foot drop with ankle dorsiflexion weakness which is chronic Sensory: normal and symmetric to light touch, pinprick, and  Vibration, In the upper and lower extremities  Coordination: finger-nose-finger, no dysmetria Reflexes: symmetric upper and lower, plantar responses were flexor bilaterally. Gait and Station: in wheelchair not ambulated DIAGNOSTIC DATA (LABS, IMAGING, TESTING) - I reviewed patient records, labs, notes, testing and imaging myself where available.  Lab Results  Component Value Date   WBC 5.2 02/08/2016   HGB 12.8 02/08/2016   HCT 39.3 02/08/2016   MCV 97.3 02/08/2016   PLT 198 02/08/2016      Component Value Date/Time   NA 140 02/08/2016 0705   K 3.9 02/08/2016 0705   CL 106 02/08/2016 0705   CO2  27 02/08/2016 0705   GLUCOSE 95 02/08/2016 0705   BUN 28 (H) 02/08/2016 0705   CREATININE 0.90 02/08/2016 0705   CREATININE 0.92 06/20/2011 1200   CALCIUM 9.6 02/08/2016 0705   PROT 6.9 02/08/2016 0705   ALBUMIN 3.3 (L) 02/08/2016 0705   AST 20 02/08/2016 0705   ALT 17 02/08/2016 0705   ALKPHOS 274 (H) 02/08/2016 0705   BILITOT 0.5 02/08/2016 0705   GFRNONAA 54 (L) 02/08/2016 0705   GFRAA >60 02/08/2016 0705   Lab Results  Component Value Date   CHOL 175 12/10/2015   HDL 59 12/10/2015   LDLCALC 95 12/10/2015   TRIG 107 12/10/2015   CHOLHDL 3.0 12/10/2015   Lab Results  Component Value Date   HGBA1C 6.1 (H) 12/10/2015    ASSESSMENT AND PLAN  82 y.o. year old female  has a past medical history of Memory loss; Leukopenia; Hyponatremia;   Unspecified essential hypertension;  Hyperlipidemia; and Stroke ([place]). here to follow up for right brain TIA     PLAN: Stressed the importance of management of risk factors to prevent further stroke Continue aspirin for secondary stroke prevention Maintain strict control of hypertension with blood pressure goal below 130/90, today's reading 138/78 continue antihypertensive medications Cholesterol with LDL cholesterol less than 70, followed by primary care,  continue Lipitor Exercise by walking with your walker, slowly increase , eat healthy diet with whole grains fresh fruits and vegetables Follow-up in 6 months Dennie Bible, [facility], [facility], APRN  [facility] [address] [place], [place] [address] [phone]

## 2016-06-20 NOTE — Progress Notes (Signed)
I agree with the above plan 

## 2016-06-23 DIAGNOSIS — M179 Osteoarthritis of knee, unspecified: Secondary | ICD-10-CM | POA: Diagnosis not present

## 2016-06-26 ENCOUNTER — Ambulatory Visit: Payer: Medicare Other | Admitting: Gastroenterology

## 2016-07-22 DIAGNOSIS — M170 Bilateral primary osteoarthritis of knee: Secondary | ICD-10-CM | POA: Diagnosis not present

## 2016-07-22 DIAGNOSIS — I10 Essential (primary) hypertension: Secondary | ICD-10-CM | POA: Diagnosis not present

## 2016-07-31 ENCOUNTER — Ambulatory Visit (INDEPENDENT_AMBULATORY_CARE_PROVIDER_SITE_OTHER): Payer: Medicare Other | Admitting: Gastroenterology

## 2016-07-31 ENCOUNTER — Encounter: Payer: Self-pay | Admitting: Gastroenterology

## 2016-07-31 DIAGNOSIS — I639 Cerebral infarction, unspecified: Secondary | ICD-10-CM | POA: Diagnosis not present

## 2016-07-31 DIAGNOSIS — Z7982 Long term (current) use of aspirin: Secondary | ICD-10-CM | POA: Diagnosis not present

## 2016-07-31 DIAGNOSIS — R748 Abnormal levels of other serum enzymes: Secondary | ICD-10-CM

## 2016-07-31 DIAGNOSIS — D539 Nutritional anemia, unspecified: Secondary | ICD-10-CM

## 2016-07-31 MED ORDER — OMEPRAZOLE 20 MG PO CPDR: 20.0000 mg | DELAYED_RELEASE_CAPSULE | Freq: Every day | ORAL | 3 refills | Status: DC

## 2016-07-31 NOTE — Assessment & Plan Note (Signed)
LAST MEASURED IN APR 2017 AND VALUES STABLE. NO WARNING SIGNS/SYMPTOMS  REPEAT ALK PHOS QYEAR.

## 2016-07-31 NOTE — Assessment & Plan Note (Signed)
NO BRBPR OR MELENA. LAST CBC APR 2017: NL  REPEAT CBC IN 6-12 MOS.

## 2016-07-31 NOTE — Patient Instructions (Signed)
CONTINUE OMEPRAZOLE.  TAKE 30 MINUTES PRIOR TO YOUR FIRST MEAL.  PLEASE CALL WITH QUESTIONS OR CONCERNS.  FOLLOW UP IN 1 YEAR.

## 2016-07-31 NOTE — Assessment & Plan Note (Addendum)
NO BRBPR OR MELENA.  PRILOSEC DAILY. DISCUSSED BENEFITS, RISKS: BLEEDING ULCERS, AND MANAGEMENT OF CHRONIC ASA USE.  FOLLOW UP IN 1 YEAR.

## 2016-07-31 NOTE — Progress Notes (Signed)
   Subjective:    Patient ID: [name], female    DOB: 1922-02-01, 82 y.o.   MRN: [id number]  [name], MD   HPI No questions or concerns. Bowels are good.TROUBLE WITH HER KNEES AND CHRONIC KNEE PAIN THAT IS NOT ADEQUATELY CONTROLLED.Marland Kitchen APPETITE GOOD. WEIGHT DOWN 15 LBS SINCE LAST YEAR.  PT DENIES FEVER, CHILLS, HEMATOCHEZIA, HEMATEMESIS, nausea, vomiting, melena, diarrhea, CHEST PAIN, SHORTNESS OF BREATH,  CHANGE IN BOWEL IN HABITS, constipation, abdominal pain, problems swallowing, OR heartburn or indigestion.  Past Medical History:  Diagnosis Date  . Arthropathy, unspecified, site unspecified   . Edema   . Hyperlipidemia   . Hyponatremia   . Leukopenia   . Memory loss   . Other and unspecified hyperlipidemia   . Proteinuria   . S/P colonoscopy Jan 2012   anal papilla, internal hemorrhoids, melanosis coli  . Stroke ([place])   . Unspecified asthma, with exacerbation   . Unspecified essential hypertension     Past Surgical History:  Procedure Laterality Date  . COLONOSCOPY  11/05/2010   Dr. Gala Romney: Anal papilla and internal hemorrhoids pigmented rectal mucosa consistent with melanosis coli/ Diffuse pigmentation of the colonic mucosa consistent with melanosis coli, localized cecal diverticula as described above  . ESOPHAGOGASTRODUODENOSCOPY  06/16/2011   Dr. Oneida Alar: moderate gastritis, no h.pylori  . None      No Known Allergies  Current Outpatient Prescriptions  Medication Sig Dispense Refill  . acetaminophen (TYLENOL) 325 MG tablet Take 650 mg by mouth every 6 (six) hours as needed for mild pain.     Marland Kitchen amLODipine (NORVASC) 5 MG tablet Take 5 mg by mouth daily.    Marland Kitchen aspirin EC 81 MG EC tablet Take 1 tablet (81 mg total) by mouth daily.    Marland Kitchen atorvastatin (LIPITOR) 10 MG tablet Take 1 tablet (10 mg total) by mouth daily at 6 PM.    . ferrous sulfate 325 (65 FE) MG tablet Take 325 mg by mouth 2 (two) times daily with a meal.    . HYDROcodone-acetaminophen  (NORCO/VICODIN) 5-325 MG tablet Take 1 tablet by mouth every 6 (six) hours as needed for moderate pain.    Marland Kitchen omeprazole (PRILOSEC) 20 MG capsule Take 20 mg by mouth daily.    . ramipril (ALTACE) 10 MG capsule Take 10 mg by mouth daily.     Review of Systems PER HPI OTHERWISE ALL SYSTEMS ARE NEGATIVE.    Objective:   Physical Exam  Constitutional: She is oriented to person, place, and time. She appears well-developed and well-nourished. No distress.  HENT:  Head: Normocephalic and atraumatic.  Mouth/Throat: Oropharynx is clear and moist. No oropharyngeal exudate.  Eyes: Pupils are equal, round, and reactive to light. No scleral icterus.  Neck: Normal range of motion. Neck supple.  Cardiovascular: Normal rate, regular rhythm and normal heart sounds.   Pulmonary/Chest: Effort normal and breath sounds normal. No respiratory distress.  Abdominal: Soft. Bowel sounds are normal. She exhibits no distension. There is no tenderness.  EXAM LIMITED-PT IN [facility]  Musculoskeletal: She exhibits no edema.  Lymphadenopathy:    She has no cervical adenopathy.  Neurological: She is alert and oriented to person, place, and time.  NO  NEW FOCAL DEFICITS  Psychiatric: She has a normal mood and affect.  Vitals reviewed.     Assessment & Plan:

## 2016-07-31 NOTE — Progress Notes (Signed)
cc'ed to pcp °

## 2016-08-01 NOTE — Progress Notes (Signed)
ON RECALL  °

## 2016-08-08 DIAGNOSIS — L851 Acquired keratosis [keratoderma] palmaris et plantaris: Secondary | ICD-10-CM | POA: Diagnosis not present

## 2016-08-08 DIAGNOSIS — I739 Peripheral vascular disease, unspecified: Secondary | ICD-10-CM | POA: Diagnosis not present

## 2016-08-08 DIAGNOSIS — B351 Tinea unguium: Secondary | ICD-10-CM | POA: Diagnosis not present

## 2016-09-09 DIAGNOSIS — J209 Acute bronchitis, unspecified: Secondary | ICD-10-CM | POA: Diagnosis not present

## 2016-09-15 DIAGNOSIS — M1712 Unilateral primary osteoarthritis, left knee: Secondary | ICD-10-CM | POA: Diagnosis not present

## 2016-09-15 DIAGNOSIS — M25562 Pain in left knee: Secondary | ICD-10-CM | POA: Diagnosis not present

## 2016-09-15 DIAGNOSIS — M1711 Unilateral primary osteoarthritis, right knee: Secondary | ICD-10-CM | POA: Diagnosis not present

## 2016-09-15 DIAGNOSIS — M25561 Pain in right knee: Secondary | ICD-10-CM | POA: Diagnosis not present

## 2016-10-17 DIAGNOSIS — L851 Acquired keratosis [keratoderma] palmaris et plantaris: Secondary | ICD-10-CM | POA: Diagnosis not present

## 2016-10-17 DIAGNOSIS — B351 Tinea unguium: Secondary | ICD-10-CM | POA: Diagnosis not present

## 2016-10-17 DIAGNOSIS — I739 Peripheral vascular disease, unspecified: Secondary | ICD-10-CM | POA: Diagnosis not present

## 2016-10-21 DIAGNOSIS — N183 Chronic kidney disease, stage 3 (moderate): Secondary | ICD-10-CM | POA: Diagnosis not present

## 2016-10-21 DIAGNOSIS — I10 Essential (primary) hypertension: Secondary | ICD-10-CM | POA: Diagnosis not present

## 2016-10-21 DIAGNOSIS — E785 Hyperlipidemia, unspecified: Secondary | ICD-10-CM | POA: Diagnosis not present

## 2016-10-21 DIAGNOSIS — M170 Bilateral primary osteoarthritis of knee: Secondary | ICD-10-CM | POA: Diagnosis not present

## 2016-12-17 ENCOUNTER — Encounter: Payer: Self-pay | Admitting: Nurse Practitioner

## 2016-12-17 ENCOUNTER — Encounter (INDEPENDENT_AMBULATORY_CARE_PROVIDER_SITE_OTHER): Payer: Self-pay

## 2016-12-17 ENCOUNTER — Ambulatory Visit (INDEPENDENT_AMBULATORY_CARE_PROVIDER_SITE_OTHER): Payer: Medicare Other | Admitting: Nurse Practitioner

## 2016-12-17 VITALS — BP 122/76 | HR 60 | Ht 65.0 in | Wt 132.6 lb

## 2016-12-17 DIAGNOSIS — I10 Essential (primary) hypertension: Secondary | ICD-10-CM | POA: Diagnosis not present

## 2016-12-17 DIAGNOSIS — E785 Hyperlipidemia, unspecified: Secondary | ICD-10-CM

## 2016-12-17 DIAGNOSIS — G458 Other transient cerebral ischemic attacks and related syndromes: Secondary | ICD-10-CM | POA: Diagnosis not present

## 2016-12-17 NOTE — Progress Notes (Signed)
[facility]  PATIENT: [name] DOB: July 04, 1922   REASON FOR VISIT:  follow-up for TIA HISTORY FROM:patient and power of attorney [place] 03/14/2018CM [name], 83 year old female returns for follow-up with history of right brain TIA which occurred in March 2017. She has not had recurrent stroke or TIA symptoms since that time. She is currently on aspirin for secondary stroke prevention with no bruising and bleeding. She remains on Lipitor without complaints of myalgias. Her labs are followed through primary care. Blood pressure well controlled in the office today at 122/76.She continues to do her home exercise program. Appetite is good she is sleeping well. Niece lives with her who is her power of attorney. She returns for follow-up without further neurologic symptoms  UPDATE 09/11/2017CM [name] 83 year old female returns for follow-up. 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 She is currently on aspirin without  recurrent stroke or TIA symptoms. Blood pressure well controlled in the office today 138/78. She remains on Lipitor without complaints of muscle aches or arthralgias. Appetite is good and she is sleeping well. She ambulates in her home with a  Walker. Her niece is her power of attorney. She returns for follow-up without further neurological symptoms  HISTORY 02/13/16 CMKatherine M [name] is an 83 y.o. female patient who was taken to the [facility] emergency room via EMS for evaluation of acute onset of leg weakness on the left. Patient unable to provide history clearly. Per discussion with Dr. Sabra Heck who called for transfer the patient to [facility], Patient developed acute left leg weakness this morning 12/09/2015 at 10:10 AM (LKW). She continued to have progressive weakness in left lower extremity when she went to the church. She was evaluated in the [place] and was noted to be a  candidate for IV TPA given her acute onset of left leg weakness. After completing IV TPA infusion, as he does not have a neurologic coverage over this weekend, Dr. Sabra Heck called for transfer the patient to [facility] for continued Post TPA neurological monitoring. She has been transferred directly to the neuro ICU at the [facility]. She reported resolution of the left leg weakness at this time. She denied any other new neurological symptoms.MRI without acute stroke was felt to be a right brain TIA without residual effects She was not on aspirin prior to admission. MRA without median or large vessel stenosis. Carotid Doppler no significant stenosis. 2-D echo no source of emboli LDL 95 and she was placed on Lipitor 10 mg daily. Hemoglobin A1c 6.1. She was sent to a skilled nursing facility for rehabilitation. She returns for follow-up today without further stroke or TIA symptoms. She is back at home now. REVIEW OF SYSTEMS: Full 14 system review of systems performed and notable only for those listed, all others are neg:  Constitutional: neg  Cardiovascular: neg Ear/Nose/Throat: hearing loss Skin: neg Eyes: neg Respiratory: neg Gastroitestinal: neg  Hematology/Lymphatic: neg  Endocrine: neg Musculoskeletal:gait abnormality Allergy/Immunology: neg Neurological: neg Psychiatric: neg Sleep : neg   ALLERGIES: No Known Allergies  HOME MEDICATIONS: Outpatient Medications Prior to Visit  Medication Sig Dispense Refill  . acetaminophen (TYLENOL) 325 MG tablet Take 650 mg by mouth every 6 (six) hours as needed for mild pain.     Marland Kitchen amLODipine (NORVASC) 5 MG tablet Take 5 mg by mouth daily.    Marland Kitchen aspirin EC 81 MG EC tablet Take 1 tablet (81 mg  total) by mouth daily. 30 tablet 2  . atorvastatin (LIPITOR) 10 MG tablet Take 1 tablet (10 mg total) by mouth daily at 6 PM. 30 tablet 2  . ferrous sulfate 325 (65 FE) MG tablet Take 325 mg by mouth 2 (two) times daily with a meal.    .  HYDROcodone-acetaminophen (NORCO/VICODIN) 5-325 MG tablet Take 1 tablet by mouth every 6 (six) hours as needed for moderate pain.    Marland Kitchen omeprazole (PRILOSEC) 20 MG capsule Take 1 capsule (20 mg total) by mouth daily. 90 capsule 3  . ramipril (ALTACE) 10 MG capsule Take 10 mg by mouth daily.     No facility-administered medications prior to visit.     PAST MEDICAL HISTORY: Past Medical History:  Diagnosis Date  . Arthropathy, unspecified, site unspecified   . Edema   . Hyperlipidemia   . Hyponatremia   . Leukopenia   . Memory loss   . Other and unspecified hyperlipidemia   . Proteinuria   . S/P colonoscopy Jan 2012   anal papilla, internal hemorrhoids, melanosis coli  . Stroke ([place])   . Unspecified asthma, with exacerbation   . Unspecified essential hypertension     PAST SURGICAL HISTORY: Past Surgical History:  Procedure Laterality Date  . COLONOSCOPY  11/05/2010   Dr. Gala Romney: Anal papilla and internal hemorrhoids pigmented rectal mucosa consistent with melanosis coli/ Diffuse pigmentation of the colonic mucosa consistent with melanosis coli, localized cecal diverticula as described above  . ESOPHAGOGASTRODUODENOSCOPY  06/16/2011   Dr. Oneida Alar: moderate gastritis, no h.pylori  . None      FAMILY HISTORY: Family History  Problem Relation Age of Onset  . Heart disease    . Arthritis    . Colon cancer Neg Hx     SOCIAL HISTORY: Social History   Social History  . Marital status: Single    Spouse name: N/A  . Number of children: 0  . Years of education: college   Occupational History  .  Retired   Social History Main Topics  . Smoking status: Former Smoker    Types: Cigarettes  . Smokeless tobacco: Never Used  . Alcohol use No  . Drug use: No  . Sexual activity: Not on file   Other Topics Concern  . Not on file   Social History Narrative   Lives with niece, [place], Peggyann Juba        PHYSICAL EXAM  Vitals:   12/17/16 1439  BP: 122/76  Pulse: 60  Weight:  132 lb 9.6 oz (60.1 kg)  Height: 5\' 5"  (1.651 m)   Body mass index is 22.07 kg/m.  Generalized: Well developed, in no acute distress  Head: normocephalic and atraumatic,. Oropharynx benign  Neck: Supple, no carotid bruits  Cardiac: Regular rate rhythm, no murmur  Musculoskeletal: No deformity   Neurological examination   Mentation: Alert oriented to time, place, history taking. Attention span and concentration appropriate. Recent and remote memory intact.  Follows all commands speech and language fluent.   Cranial nerve II-XII: Pupils irregular but reactive,  extraocular movements were full, visual field were full on confrontational test. Facial sensation and strength were normal. hearing was intact to finger rubbing bilaterally. Uvula tongue midline. head turning and shoulder shrug were normal and symmetric.Tongue protrusion into cheek strength was normal. Motor: normal bulk and tone, full strength in the BUE, BLE, fine finger movements slow, no pronator drift. Right foot drop with ankle dorsiflexion weakness which is chronic Sensory: normal and symmetric to light touch,  pinprick, and  Vibration, In the upper and lower extremities  Coordination: finger-nose-finger, no dysmetria Reflexes: symmetric upper and lower, plantar responses were flexor bilaterally. Gait and Station: ambulated short distance with a walker, slow steady ambulation DIAGNOSTIC DATA (LABS, IMAGING, TESTING) - I reviewed patient records, labs, notes, testing and imaging myself where available.  Lab Results  Component Value Date   WBC 5.2 02/08/2016   HGB 12.8 02/08/2016   HCT 39.3 02/08/2016   MCV 97.3 02/08/2016   PLT 198 02/08/2016      Component Value Date/Time   NA 140 02/08/2016 0705   K 3.9 02/08/2016 0705   CL 106 02/08/2016 0705   CO2 27 02/08/2016 0705   GLUCOSE 95 02/08/2016 0705   BUN 28 (H) 02/08/2016 0705   CREATININE 0.90 02/08/2016 0705   CREATININE 0.92 06/20/2011 1200   CALCIUM 9.6  02/08/2016 0705   PROT 6.9 02/08/2016 0705   ALBUMIN 3.3 (L) 02/08/2016 0705   AST 20 02/08/2016 0705   ALT 17 02/08/2016 0705   ALKPHOS 274 (H) 02/08/2016 0705   BILITOT 0.5 02/08/2016 0705   GFRNONAA 54 (L) 02/08/2016 0705   GFRAA >60 02/08/2016 0705   Lab Results  Component Value Date   CHOL 175 12/10/2015   HDL 59 12/10/2015   LDLCALC 95 12/10/2015   TRIG 107 12/10/2015   CHOLHDL 3.0 12/10/2015   Lab Results  Component Value Date   HGBA1C 6.1 (H) 12/10/2015    ASSESSMENT AND PLAN  83 y.o. year old female  has a past medical history of Memory loss; Leukopenia; Hyponatremia;   Unspecified essential hypertension;  Hyperlipidemia; and Stroke ([place]). here to follow up for right brain TIA    PLAN: Stressed the importance of continued management of risk factors to prevent further stroke Continue aspirin for secondary stroke prevention Maintain strict control of hypertension with blood pressure goal below 130/90, today's reading 122/76 continue antihypertensive medications Cholesterol with LDL cholesterol less than 70, followed by primary care,  continue Lipitor Exercise by walking with your walker, slowly increase , eat healthy diet with whole grains fresh fruits and vegetables Use walker at all times for safe ambulation Discharge from Neurology clinic REMEMBER Pneumonic FAST which stands for  F stands  for face drooping and weakness etc. A stands for arms, weakness S stands for speech slurred  T stands for time to call 911 I spent 20 min in total face to face time with the patient more than 50% of which was spent counseling and coordination of care, reviewing test results reviewing medications and discussing and reviewing the diagnosis of TIA and management of stroke risk factors Dennie Bible, [facility], [facility], APRN  [facility] [address], [place], [place] [address] [phone]

## 2016-12-17 NOTE — Patient Instructions (Signed)
Stressed the importance of continued management of risk factors to prevent further stroke Continue aspirin for secondary stroke prevention Maintain strict control of hypertension with blood pressure goal below 130/90, today's reading 122/76 continue antihypertensive medications Cholesterol with LDL cholesterol less than 70, followed by primary care,  continue Lipitor Exercise by walking with your walker, slowly increase , eat healthy diet with whole grains fresh fruits and vegetables Use walker at all times for safe ambulation Discharge from Neurology clinic REMEMBER Pneumonic FAST which stands for  F stands  for face drooping and weakness etc. A stands for arms, weakness S stands for speech slurred  T stands for time to call 911

## 2016-12-19 NOTE — Progress Notes (Signed)
I agree with the above plan 

## 2016-12-22 DIAGNOSIS — M25562 Pain in left knee: Secondary | ICD-10-CM | POA: Diagnosis not present

## 2016-12-22 DIAGNOSIS — M170 Bilateral primary osteoarthritis of knee: Secondary | ICD-10-CM | POA: Diagnosis not present

## 2016-12-22 DIAGNOSIS — M25561 Pain in right knee: Secondary | ICD-10-CM | POA: Diagnosis not present

## 2017-01-02 DIAGNOSIS — B351 Tinea unguium: Secondary | ICD-10-CM | POA: Diagnosis not present

## 2017-01-02 DIAGNOSIS — L851 Acquired keratosis [keratoderma] palmaris et plantaris: Secondary | ICD-10-CM | POA: Diagnosis not present

## 2017-01-02 DIAGNOSIS — I739 Peripheral vascular disease, unspecified: Secondary | ICD-10-CM | POA: Diagnosis not present

## 2017-01-20 DIAGNOSIS — M170 Bilateral primary osteoarthritis of knee: Secondary | ICD-10-CM | POA: Diagnosis not present

## 2017-01-20 DIAGNOSIS — I10 Essential (primary) hypertension: Secondary | ICD-10-CM | POA: Diagnosis not present

## 2017-01-20 DIAGNOSIS — N183 Chronic kidney disease, stage 3 (moderate): Secondary | ICD-10-CM | POA: Diagnosis not present

## 2017-03-03 DIAGNOSIS — I10 Essential (primary) hypertension: Secondary | ICD-10-CM | POA: Diagnosis not present

## 2017-03-03 DIAGNOSIS — M170 Bilateral primary osteoarthritis of knee: Secondary | ICD-10-CM | POA: Diagnosis not present

## 2017-03-20 DIAGNOSIS — I739 Peripheral vascular disease, unspecified: Secondary | ICD-10-CM | POA: Diagnosis not present

## 2017-03-20 DIAGNOSIS — B351 Tinea unguium: Secondary | ICD-10-CM | POA: Diagnosis not present

## 2017-03-20 DIAGNOSIS — L851 Acquired keratosis [keratoderma] palmaris et plantaris: Secondary | ICD-10-CM | POA: Diagnosis not present

## 2017-03-24 DIAGNOSIS — H524 Presbyopia: Secondary | ICD-10-CM | POA: Diagnosis not present

## 2017-03-24 DIAGNOSIS — Z961 Presence of intraocular lens: Secondary | ICD-10-CM | POA: Diagnosis not present

## 2017-04-20 DIAGNOSIS — M1711 Unilateral primary osteoarthritis, right knee: Secondary | ICD-10-CM | POA: Diagnosis not present

## 2017-04-20 DIAGNOSIS — M25561 Pain in right knee: Secondary | ICD-10-CM | POA: Diagnosis not present

## 2017-04-20 DIAGNOSIS — M25562 Pain in left knee: Secondary | ICD-10-CM | POA: Diagnosis not present

## 2017-04-20 DIAGNOSIS — M1712 Unilateral primary osteoarthritis, left knee: Secondary | ICD-10-CM | POA: Diagnosis not present

## 2017-04-27 DIAGNOSIS — R2689 Other abnormalities of gait and mobility: Secondary | ICD-10-CM | POA: Diagnosis not present

## 2017-04-27 DIAGNOSIS — I10 Essential (primary) hypertension: Secondary | ICD-10-CM | POA: Diagnosis not present

## 2017-04-27 DIAGNOSIS — N183 Chronic kidney disease, stage 3 (moderate): Secondary | ICD-10-CM | POA: Diagnosis not present

## 2017-04-27 DIAGNOSIS — M170 Bilateral primary osteoarthritis of knee: Secondary | ICD-10-CM | POA: Diagnosis not present

## 2017-05-29 DIAGNOSIS — L851 Acquired keratosis [keratoderma] palmaris et plantaris: Secondary | ICD-10-CM | POA: Diagnosis not present

## 2017-05-29 DIAGNOSIS — I739 Peripheral vascular disease, unspecified: Secondary | ICD-10-CM | POA: Diagnosis not present

## 2017-05-29 DIAGNOSIS — B351 Tinea unguium: Secondary | ICD-10-CM | POA: Diagnosis not present

## 2017-06-16 ENCOUNTER — Encounter: Payer: Self-pay | Admitting: Gastroenterology

## 2017-07-27 DIAGNOSIS — M170 Bilateral primary osteoarthritis of knee: Secondary | ICD-10-CM | POA: Diagnosis not present

## 2017-07-27 DIAGNOSIS — F410 Panic disorder [episodic paroxysmal anxiety] without agoraphobia: Secondary | ICD-10-CM | POA: Diagnosis not present

## 2017-07-27 DIAGNOSIS — M1990 Unspecified osteoarthritis, unspecified site: Secondary | ICD-10-CM | POA: Diagnosis not present

## 2017-07-27 DIAGNOSIS — Z1389 Encounter for screening for other disorder: Secondary | ICD-10-CM | POA: Diagnosis not present

## 2017-07-27 DIAGNOSIS — Z23 Encounter for immunization: Secondary | ICD-10-CM | POA: Diagnosis not present

## 2017-07-27 DIAGNOSIS — N183 Chronic kidney disease, stage 3 (moderate): Secondary | ICD-10-CM | POA: Diagnosis not present

## 2017-07-27 DIAGNOSIS — R809 Proteinuria, unspecified: Secondary | ICD-10-CM | POA: Diagnosis not present

## 2017-07-27 DIAGNOSIS — I10 Essential (primary) hypertension: Secondary | ICD-10-CM | POA: Diagnosis not present

## 2017-07-27 DIAGNOSIS — E785 Hyperlipidemia, unspecified: Secondary | ICD-10-CM | POA: Diagnosis not present

## 2017-07-27 DIAGNOSIS — D649 Anemia, unspecified: Secondary | ICD-10-CM | POA: Diagnosis not present

## 2017-07-27 DIAGNOSIS — Z0000 Encounter for general adult medical examination without abnormal findings: Secondary | ICD-10-CM | POA: Diagnosis not present

## 2017-07-28 DIAGNOSIS — M79671 Pain in right foot: Secondary | ICD-10-CM | POA: Diagnosis not present

## 2017-07-28 DIAGNOSIS — L97512 Non-pressure chronic ulcer of other part of right foot with fat layer exposed: Secondary | ICD-10-CM | POA: Diagnosis not present

## 2017-08-03 DIAGNOSIS — M170 Bilateral primary osteoarthritis of knee: Secondary | ICD-10-CM | POA: Diagnosis not present

## 2017-08-06 ENCOUNTER — Encounter: Payer: Self-pay | Admitting: Gastroenterology

## 2017-08-06 ENCOUNTER — Ambulatory Visit (INDEPENDENT_AMBULATORY_CARE_PROVIDER_SITE_OTHER): Payer: Medicare Other | Admitting: Gastroenterology

## 2017-08-06 DIAGNOSIS — Z7982 Long term (current) use of aspirin: Secondary | ICD-10-CM

## 2017-08-06 DIAGNOSIS — R748 Abnormal levels of other serum enzymes: Secondary | ICD-10-CM | POA: Diagnosis not present

## 2017-08-06 DIAGNOSIS — D500 Iron deficiency anemia secondary to blood loss (chronic): Secondary | ICD-10-CM | POA: Diagnosis not present

## 2017-08-06 NOTE — Progress Notes (Signed)
CC'ED TO PCP 

## 2017-08-06 NOTE — Assessment & Plan Note (Signed)
CONTINUES ON ASA DAILY.  TAKE OMEPRAZOLE ONCE DAILY WITH BREAKFAST FOREVER TO PROTECT YOUR STOMACH FROM INJURY FROM ASPIRIN.   FOLLOW UP IN 1 YEAR.

## 2017-08-06 NOTE — Progress Notes (Signed)
ON RECALL  °

## 2017-08-06 NOTE — Assessment & Plan Note (Signed)
LAST Hb 12.11 Feb 2016. NO BRBPR OR MELENA.  GET THE LAB RESULTS FROM DR. FANTA. NEEDS CBC IN 2018. TAKE OMEPRAZOLE ONCE DAILY WITH BREAKFAST FOREVER TO PROTECT YOUR STOMACH FROM INJURY FROM ASPIRIN.   FOLLOW UP IN 1 YEAR.

## 2017-08-06 NOTE — Patient Instructions (Addendum)
I WILL GET THE LAB RESULTS FROM DR. FANTA. I WANT TO MAKE SURE YOUR BLOOD COUNT IS NORMAL.  TAKE OMEPRAZOLE ONCE DAILY WITH BREAKFAST FOREVER TO PROTECT YOUR STOMACH FROM INJURY FROM ASPIRIN.   FOLLOW UP IN 1 YEAR.

## 2017-08-06 NOTE — Progress Notes (Signed)
Subjective:    Patient ID: [name], female    DOB: 1922-06-14, 83 y.o.   MRN: [id number]  [name], MD  HPI NOT REALLY HUNGRY AND HAD BETTER THINGS TO DO THAN EAT. BOOST: 1 OR 2 A DAY. BMS: GOOD, ONCE IN AWHILE HAS CONSTIPATION. ONLY PROBLEM IS WITH KNEES.  PT DENIES FEVER, CHILLS, HEMATOCHEZIA, HEMATEMESIS, nausea, vomiting, melena, diarrhea, CHEST PAIN, SHORTNESS OF BREATH,  CHANGE IN BOWEL IN HABITS, abdominal pain, problems swallowing, OR heartburn or indigestion.  Past Medical History:  Diagnosis Date  . Arthropathy, unspecified, site unspecified   . Edema   . Hyperlipidemia   . Hyponatremia   . Leukopenia   . Memory loss   . Other and unspecified hyperlipidemia   . Proteinuria   . S/P colonoscopy Jan 2012   anal papilla, internal hemorrhoids, melanosis coli  . Stroke ([place])   . Unspecified asthma, with exacerbation   . Unspecified essential hypertension    Past Surgical History:  Procedure Laterality Date  . COLONOSCOPY  11/05/2010   Dr. Gala Romney: Anal papilla and internal hemorrhoids pigmented rectal mucosa consistent with melanosis coli/ Diffuse pigmentation of the colonic mucosa consistent with melanosis coli, localized cecal diverticula as described above  . ESOPHAGOGASTRODUODENOSCOPY  06/16/2011   Dr. Oneida Alar: moderate gastritis, no h.pylori  . None     No Known Allergies  Current Outpatient Prescriptions  Medication Sig Dispense Refill  . acetaminophen (TYLENOL) 325 MG tablet Take 650 mg by mouth every 6 (six) hours as needed for mild pain.     Marland Kitchen amLODipine (NORVASC) 5 MG tablet Take 5 mg by mouth daily.    Marland Kitchen aspirin EC 81 MG EC tablet Take 1 tablet (81 mg total) by mouth daily.    Marland Kitchen atorvastatin (LIPITOR) 10 MG tablet Take 1 tablet (10 mg total) by mouth daily at 6 PM.    . ferrous sulfate 325 (65 FE) MG tablet Take 325 mg by mouth 2 (two) times daily with a meal. ONCE DAILY   . omeprazole (PRILOSEC) 20 MG capsule Take 1 capsule (20 mg total) by  mouth daily.    . ramipril (ALTACE) 10 MG capsule Take 10 mg by mouth daily.    . traMADol (ULTRAM) 50 MG tablet Take 50 mg by mouth 2 (two) times daily.    .      . HYDROcodone-acetaminophen (NORCO/VICODIN) 5-325 MG tablet Take 1 tablet by mouth every 6 (six) hours as needed for moderate pain.     Review of Systems     Objective:   Physical Exam  Constitutional: She is oriented to person, place, and time. She appears well-developed and well-nourished. No distress.  HENT:  Head: Normocephalic and atraumatic.  Mouth/Throat: Oropharynx is clear and moist. No oropharyngeal exudate.  Eyes: Pupils are equal, round, and reactive to light. No scleral icterus.  Neck: Normal range of motion. Neck supple.  Cardiovascular: Normal rate, regular rhythm and normal heart sounds.   Pulmonary/Chest: Effort normal and breath sounds normal. No respiratory distress.  Abdominal: Soft. Bowel sounds are normal. She exhibits no distension. There is no tenderness.  LIMITED EXAM PT IN [facility]  Musculoskeletal: She exhibits edema (TRACE -1+ BILATERAL LEs, SUPPORT HOSE INPLACE BILATERAL LEs).  Lymphadenopathy:    She has no cervical adenopathy.  Neurological: She is alert and oriented to person, place, and time.  NO  NEW FOCAL DEFICITS  Psychiatric: She has a normal mood and affect.  Vitals reviewed.     Assessment & Plan:

## 2017-08-06 NOTE — Assessment & Plan Note (Signed)
LABS STABLE FOR 10 YEARS.  NO NEED FOR CONTINUED SURVEILLANCE.

## 2017-08-07 DIAGNOSIS — B351 Tinea unguium: Secondary | ICD-10-CM | POA: Diagnosis not present

## 2017-08-07 DIAGNOSIS — L97512 Non-pressure chronic ulcer of other part of right foot with fat layer exposed: Secondary | ICD-10-CM | POA: Diagnosis not present

## 2017-08-07 DIAGNOSIS — L851 Acquired keratosis [keratoderma] palmaris et plantaris: Secondary | ICD-10-CM | POA: Diagnosis not present

## 2017-08-07 DIAGNOSIS — I739 Peripheral vascular disease, unspecified: Secondary | ICD-10-CM | POA: Diagnosis not present

## 2017-08-25 DIAGNOSIS — L97512 Non-pressure chronic ulcer of other part of right foot with fat layer exposed: Secondary | ICD-10-CM | POA: Diagnosis not present

## 2017-09-07 DIAGNOSIS — M25561 Pain in right knee: Secondary | ICD-10-CM | POA: Diagnosis not present

## 2017-09-07 DIAGNOSIS — M25562 Pain in left knee: Secondary | ICD-10-CM | POA: Diagnosis not present

## 2017-09-07 DIAGNOSIS — M170 Bilateral primary osteoarthritis of knee: Secondary | ICD-10-CM | POA: Diagnosis not present

## 2017-09-07 DIAGNOSIS — G8929 Other chronic pain: Secondary | ICD-10-CM | POA: Diagnosis not present

## 2017-09-08 ENCOUNTER — Other Ambulatory Visit (HOSPITAL_COMMUNITY): Payer: Self-pay | Admitting: Podiatry

## 2017-09-08 DIAGNOSIS — L97512 Non-pressure chronic ulcer of other part of right foot with fat layer exposed: Secondary | ICD-10-CM | POA: Diagnosis not present

## 2017-09-08 DIAGNOSIS — I739 Peripheral vascular disease, unspecified: Secondary | ICD-10-CM | POA: Diagnosis not present

## 2017-09-11 ENCOUNTER — Ambulatory Visit (HOSPITAL_COMMUNITY)
Admission: RE | Admit: 2017-09-11 | Discharge: 2017-09-11 | Disposition: A | Discharge status: Home / Self Care | Payer: Medicare Other | Source: Ambulatory Visit | Attending: Podiatry | Admitting: Podiatry

## 2017-09-11 ENCOUNTER — Ambulatory Visit (HOSPITAL_COMMUNITY): Admission: RE | Admit: 2017-09-11 | Payer: Medicare Other | Source: Ambulatory Visit

## 2017-09-11 DIAGNOSIS — L97512 Non-pressure chronic ulcer of other part of right foot with fat layer exposed: Secondary | ICD-10-CM | POA: Insufficient documentation

## 2017-09-11 DIAGNOSIS — I70201 Unspecified atherosclerosis of native arteries of extremities, right leg: Secondary | ICD-10-CM | POA: Insufficient documentation

## 2017-09-11 DIAGNOSIS — L97509 Non-pressure chronic ulcer of other part of unspecified foot with unspecified severity: Secondary | ICD-10-CM | POA: Diagnosis not present

## 2017-09-11 DIAGNOSIS — I739 Peripheral vascular disease, unspecified: Secondary | ICD-10-CM

## 2017-09-22 DIAGNOSIS — I10 Essential (primary) hypertension: Secondary | ICD-10-CM | POA: Diagnosis not present

## 2017-09-22 DIAGNOSIS — I70235 Atherosclerosis of native arteries of right leg with ulceration of other part of foot: Secondary | ICD-10-CM | POA: Diagnosis not present

## 2017-09-22 DIAGNOSIS — E785 Hyperlipidemia, unspecified: Secondary | ICD-10-CM | POA: Diagnosis not present

## 2017-09-22 DIAGNOSIS — L97518 Non-pressure chronic ulcer of other part of right foot with other specified severity: Secondary | ICD-10-CM | POA: Diagnosis not present

## 2017-09-22 DIAGNOSIS — Z79899 Other long term (current) drug therapy: Secondary | ICD-10-CM | POA: Diagnosis not present

## 2017-09-22 DIAGNOSIS — K219 Gastro-esophageal reflux disease without esophagitis: Secondary | ICD-10-CM | POA: Diagnosis not present

## 2017-09-22 DIAGNOSIS — M1990 Unspecified osteoarthritis, unspecified site: Secondary | ICD-10-CM | POA: Diagnosis not present

## 2017-09-22 DIAGNOSIS — L97512 Non-pressure chronic ulcer of other part of right foot with fat layer exposed: Secondary | ICD-10-CM | POA: Diagnosis not present

## 2017-10-01 DIAGNOSIS — I10 Essential (primary) hypertension: Secondary | ICD-10-CM | POA: Diagnosis not present

## 2017-10-01 DIAGNOSIS — M1990 Unspecified osteoarthritis, unspecified site: Secondary | ICD-10-CM | POA: Diagnosis not present

## 2017-10-01 DIAGNOSIS — I70235 Atherosclerosis of native arteries of right leg with ulceration of other part of foot: Secondary | ICD-10-CM | POA: Diagnosis not present

## 2017-10-01 DIAGNOSIS — Z79899 Other long term (current) drug therapy: Secondary | ICD-10-CM | POA: Diagnosis not present

## 2017-10-01 DIAGNOSIS — E785 Hyperlipidemia, unspecified: Secondary | ICD-10-CM | POA: Diagnosis not present

## 2017-10-01 DIAGNOSIS — L97512 Non-pressure chronic ulcer of other part of right foot with fat layer exposed: Secondary | ICD-10-CM | POA: Diagnosis not present

## 2017-10-01 DIAGNOSIS — L97518 Non-pressure chronic ulcer of other part of right foot with other specified severity: Secondary | ICD-10-CM | POA: Diagnosis not present

## 2017-10-01 DIAGNOSIS — K219 Gastro-esophageal reflux disease without esophagitis: Secondary | ICD-10-CM | POA: Diagnosis not present

## 2017-10-16 DIAGNOSIS — L851 Acquired keratosis [keratoderma] palmaris et plantaris: Secondary | ICD-10-CM | POA: Diagnosis not present

## 2017-10-16 DIAGNOSIS — I739 Peripheral vascular disease, unspecified: Secondary | ICD-10-CM | POA: Diagnosis not present

## 2017-10-16 DIAGNOSIS — B351 Tinea unguium: Secondary | ICD-10-CM | POA: Diagnosis not present

## 2017-10-29 DIAGNOSIS — M170 Bilateral primary osteoarthritis of knee: Secondary | ICD-10-CM | POA: Diagnosis not present

## 2017-10-29 DIAGNOSIS — E785 Hyperlipidemia, unspecified: Secondary | ICD-10-CM | POA: Diagnosis not present

## 2017-10-29 DIAGNOSIS — N183 Chronic kidney disease, stage 3 (moderate): Secondary | ICD-10-CM | POA: Diagnosis not present

## 2017-10-29 DIAGNOSIS — I10 Essential (primary) hypertension: Secondary | ICD-10-CM | POA: Diagnosis not present

## 2017-11-10 DIAGNOSIS — Z872 Personal history of diseases of the skin and subcutaneous tissue: Secondary | ICD-10-CM | POA: Diagnosis not present

## 2017-11-10 DIAGNOSIS — L97512 Non-pressure chronic ulcer of other part of right foot with fat layer exposed: Secondary | ICD-10-CM | POA: Diagnosis not present

## 2017-11-10 DIAGNOSIS — Z09 Encounter for follow-up examination after completed treatment for conditions other than malignant neoplasm: Secondary | ICD-10-CM | POA: Diagnosis not present

## 2017-11-10 DIAGNOSIS — I70235 Atherosclerosis of native arteries of right leg with ulceration of other part of foot: Secondary | ICD-10-CM | POA: Diagnosis not present

## 2017-12-02 IMAGING — MR MR HEAD W/O CM
8 of 12 series · 29 of 48 positions shown · non-contrast
Comparison: CT head without contrast 12/09/2015.

CLINICAL DATA: Acute onset of left lower extremity weakness.
Symptoms began at [DATE] yesterday. TPA was given.

EXAM:
MRI HEAD WITHOUT CONTRAST
MRA HEAD WITHOUT CONTRAST
TECHNIQUE: Multiplanar, multiecho pulse sequences of the brain and surrounding
structures were obtained without intravenous contrast. Angiographic
images of the head were obtained using MRA technique without
contrast.

[Series 3: DWI · axial · 3.0mm · 1.09mm/px · z∈[-71,+60]mm · 7 of 90 slices shown (1 of 3)]
[im 1/90]
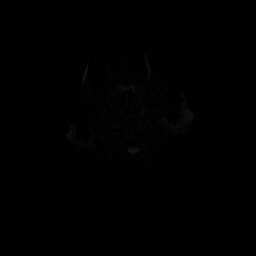
[im 15/90]
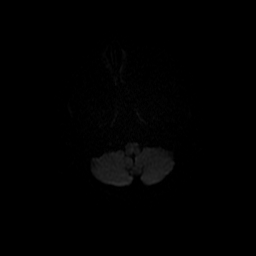
[im 30/90]
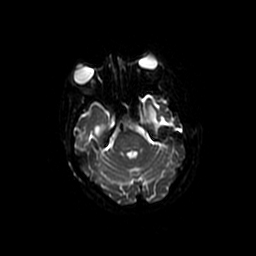
[im 45/90]
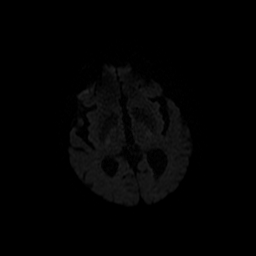
[im 60/90]
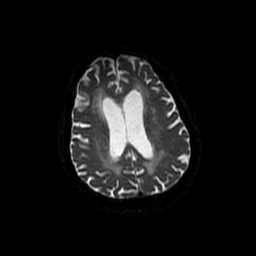
[im 75/90]
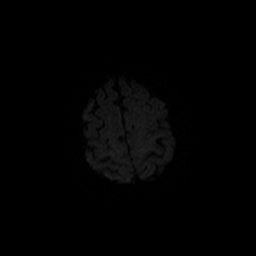
[im 90/90]
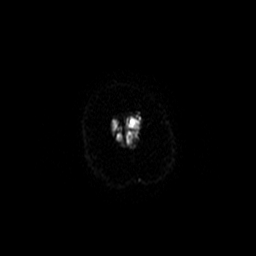

[Series 4: (id) mt fs · axial · 1.4mm · 0.43mm/px · z∈[-74,-17]mm · 5 of 152 slices shown]
[im 1/152]
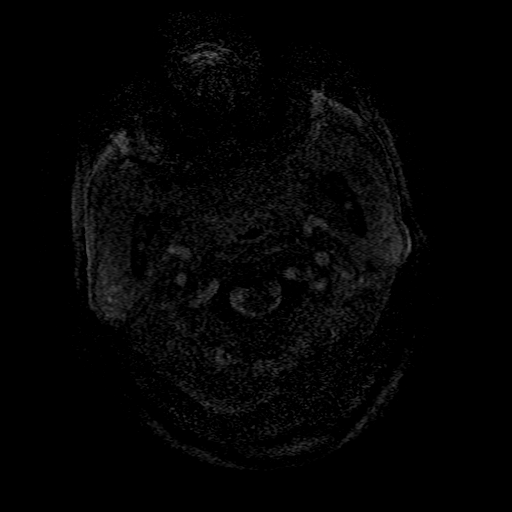
[im 28/152]
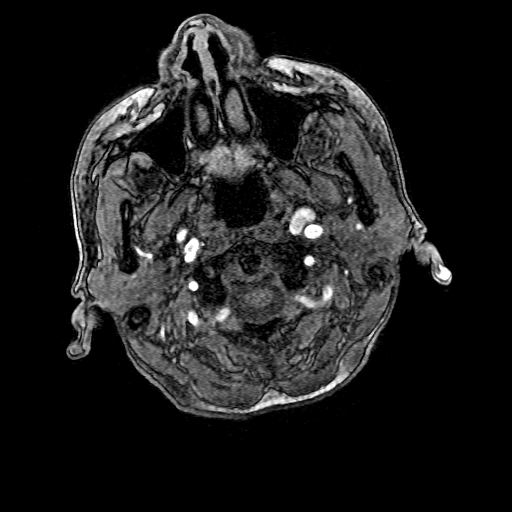
[im 42/152]
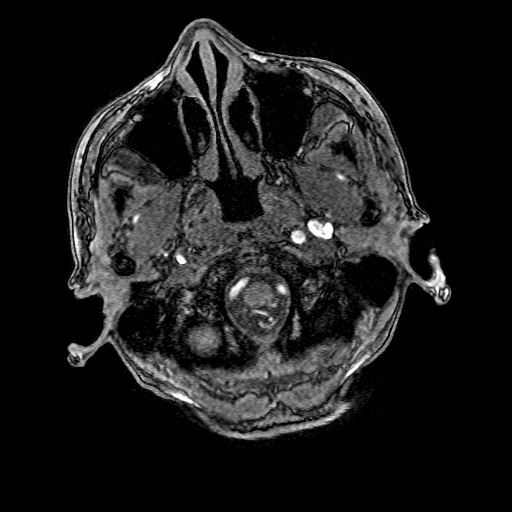
[im 69/152]
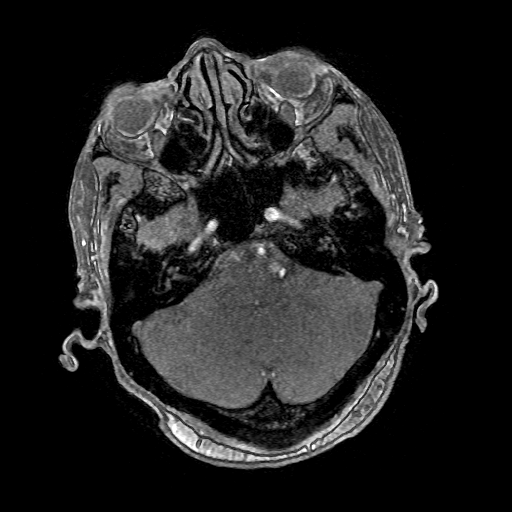
[im 83/152]
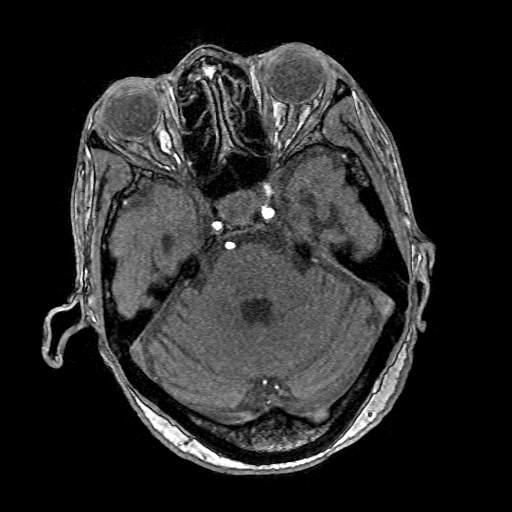

[Series 5: T1 · sagittal · 5.0mm · 0.47mm/px · 2 of 23 slices shown]
[im 1/23]
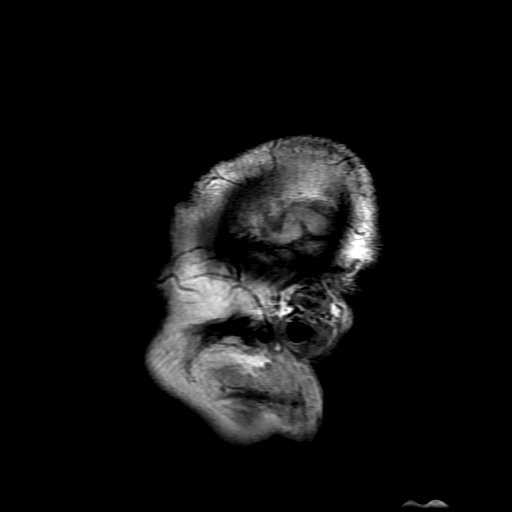
[im 23/23]
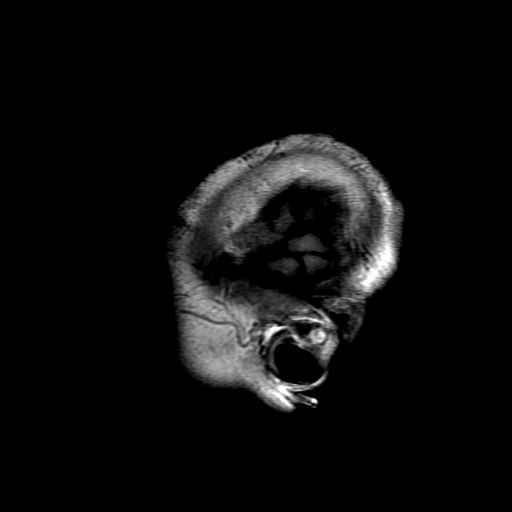

[Series 6: T2 · axial · 5.0mm · 0.43mm/px · z∈[-67,+64]mm · 2 of 23 slices shown (1 of 2)]
[im 1/23]
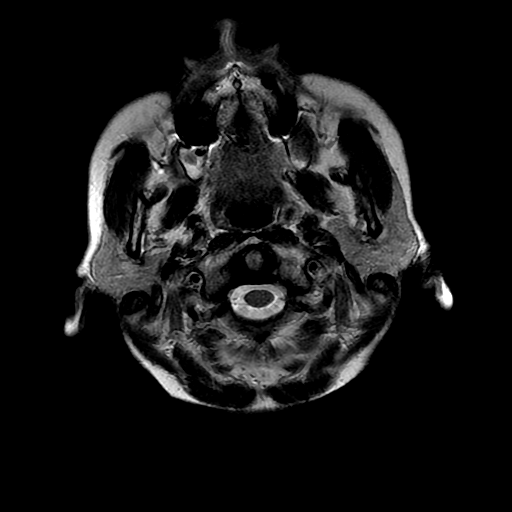
[im 23/23]
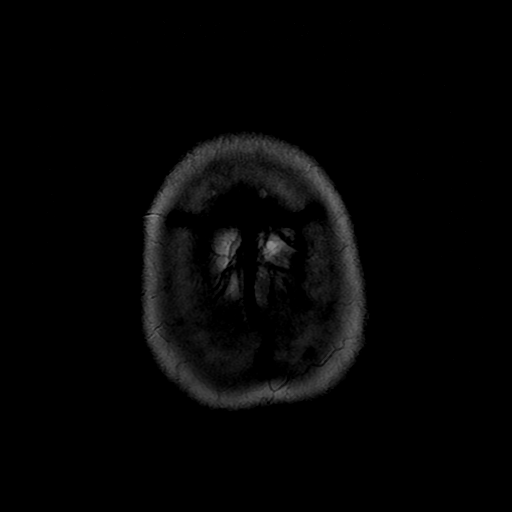

[Series 7: DWI · coronal · 5.0mm · 1.09mm/px · 5 of 64 slices shown (2 of 3)]
[im 1/64]
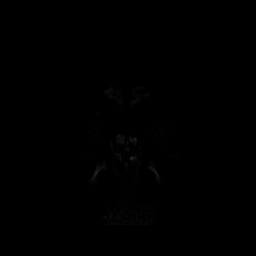
[im 16/64]
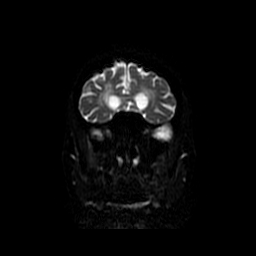
[im 32/64]
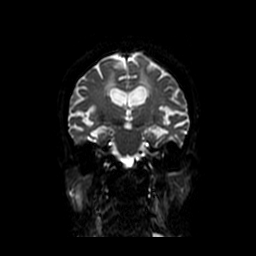
[im 48/64]
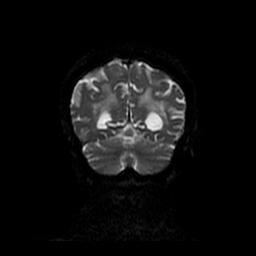
[im 64/64]
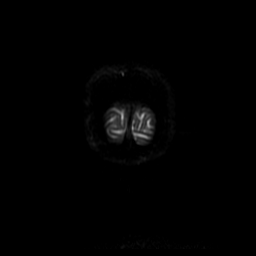

[Series 8: FLAIR · axial · 5.0mm · 0.43mm/px · z∈[-67,+64]mm · 2 of 23 slices shown]
[im 1/23]
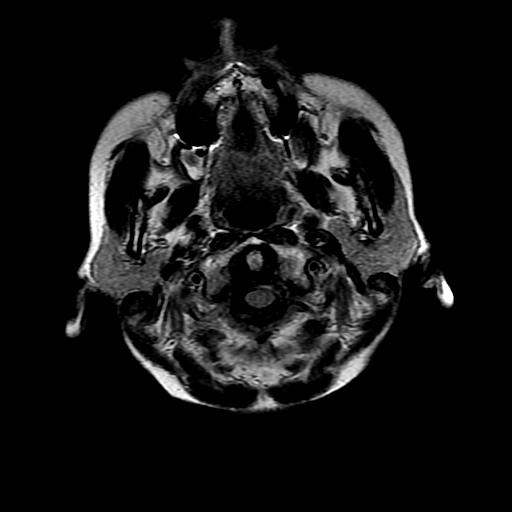
[im 23/23]
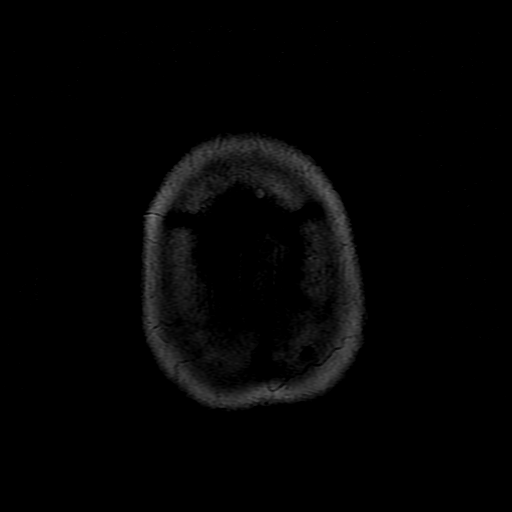

[Series 11: T2 · coronal · 5.0mm · 0.39mm/px · 2 of 25 slices shown (2 of 2)]
[im 1/25]
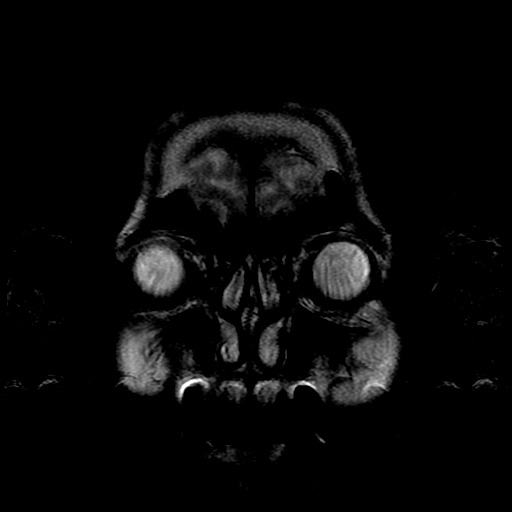
[im 25/25]
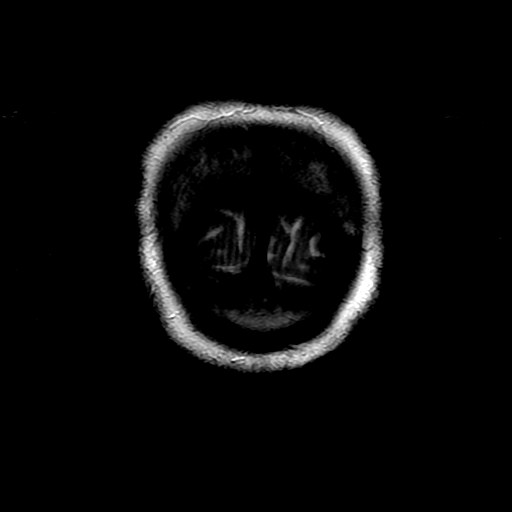

[Series 300: DWI · axial · 3.0mm · 1.09mm/px · z∈[-71,+60]mm · 4 of 45 slices shown (3 of 3)]
[im 1/45]
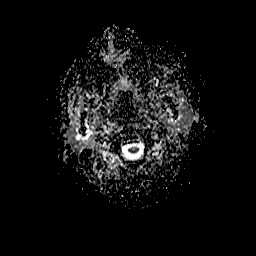
[im 15/45]
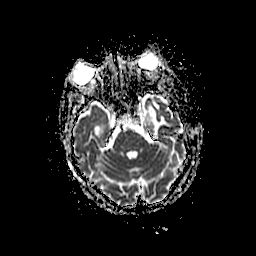
[im 30/45]
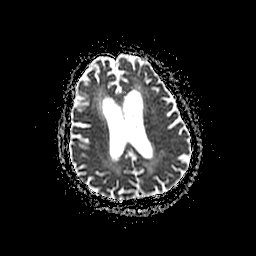
[im 45/45]
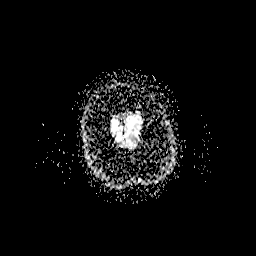

[29 of 48 positions shown; findings below may reference images not displayed]

FINDINGS: MRI HEAD FINDINGS

The diffusion-weighted images demonstrate no evidence for acute or
subacute infarction. No acute hemorrhage or mass lesion is present.
Mild to moderate generalized atrophy is present. There is moderate
diffuse confluent periventricular and subcortical white matter
disease bilaterally. The basal ganglia are intact. Mild white matter
changes extend into the brainstem. The cerebellum is within normal
limits.

Flow is present in the major intracranial arteries. Bilateral lens
replacements are present. The globes and orbits are otherwise
intact. There is some fluid in the right mastoid air cells. The
paranasal sinuses and mastoid air cells are otherwise clear.

The skullbase is within normal limits. Midline sagittal images are
unremarkable.

MRA HEAD FINDINGS

Moderate tortuosity is present within the cervical internal carotid
arteries bilaterally. There is no focal stenosis through the ICA
terminus. The right A1 segment is aplastic. Both A2 segments are
opacified with a patent anterior communicating artery. The MCA
bifurcations are intact. The ACA and MCA branch vessels are within
normal limits. There is mild attenuation of distal small vessels.

The right vertebral artery is slightly dominant left. The PICA
origins are visualized and normal. The basilar artery is normal.
Both posterior cerebral arteries originate from the basilar tip. The
PCA branch vessels are within normal limits.
IMPRESSION: 1. No evidence for acute or subacute infarction.
2. Mild to moderate generalized atrophy and moderate diffuse white
matter disease. This likely reflects the sequela of chronic
microvascular ischemia.
3. MRA circle of Willis without evidence for significant proximal
stenosis, aneurysm, or branch vessel disease. Mild distal small
vessel disease is present.

## 2017-12-23 DIAGNOSIS — M25562 Pain in left knee: Secondary | ICD-10-CM | POA: Diagnosis not present

## 2017-12-23 DIAGNOSIS — M25561 Pain in right knee: Secondary | ICD-10-CM | POA: Diagnosis not present

## 2017-12-23 DIAGNOSIS — M170 Bilateral primary osteoarthritis of knee: Secondary | ICD-10-CM | POA: Diagnosis not present

## 2017-12-25 DIAGNOSIS — B351 Tinea unguium: Secondary | ICD-10-CM | POA: Diagnosis not present

## 2017-12-25 DIAGNOSIS — L851 Acquired keratosis [keratoderma] palmaris et plantaris: Secondary | ICD-10-CM | POA: Diagnosis not present

## 2017-12-25 DIAGNOSIS — I739 Peripheral vascular disease, unspecified: Secondary | ICD-10-CM | POA: Diagnosis not present

## 2018-01-28 DIAGNOSIS — I10 Essential (primary) hypertension: Secondary | ICD-10-CM | POA: Diagnosis not present

## 2018-01-28 DIAGNOSIS — N183 Chronic kidney disease, stage 3 (moderate): Secondary | ICD-10-CM | POA: Diagnosis not present

## 2018-01-28 DIAGNOSIS — E785 Hyperlipidemia, unspecified: Secondary | ICD-10-CM | POA: Diagnosis not present

## 2018-01-28 DIAGNOSIS — M170 Bilateral primary osteoarthritis of knee: Secondary | ICD-10-CM | POA: Diagnosis not present

## 2018-03-12 DIAGNOSIS — B351 Tinea unguium: Secondary | ICD-10-CM | POA: Diagnosis not present

## 2018-03-12 DIAGNOSIS — I739 Peripheral vascular disease, unspecified: Secondary | ICD-10-CM | POA: Diagnosis not present

## 2018-03-12 DIAGNOSIS — L851 Acquired keratosis [keratoderma] palmaris et plantaris: Secondary | ICD-10-CM | POA: Diagnosis not present

## 2018-03-24 DIAGNOSIS — M170 Bilateral primary osteoarthritis of knee: Secondary | ICD-10-CM | POA: Diagnosis not present

## 2018-04-29 DIAGNOSIS — N183 Chronic kidney disease, stage 3 (moderate): Secondary | ICD-10-CM | POA: Diagnosis not present

## 2018-04-29 DIAGNOSIS — I10 Essential (primary) hypertension: Secondary | ICD-10-CM | POA: Diagnosis not present

## 2018-04-29 DIAGNOSIS — M170 Bilateral primary osteoarthritis of knee: Secondary | ICD-10-CM | POA: Diagnosis not present

## 2018-04-29 DIAGNOSIS — E785 Hyperlipidemia, unspecified: Secondary | ICD-10-CM | POA: Diagnosis not present

## 2018-05-21 DIAGNOSIS — B351 Tinea unguium: Secondary | ICD-10-CM | POA: Diagnosis not present

## 2018-05-21 DIAGNOSIS — L851 Acquired keratosis [keratoderma] palmaris et plantaris: Secondary | ICD-10-CM | POA: Diagnosis not present

## 2018-05-21 DIAGNOSIS — I739 Peripheral vascular disease, unspecified: Secondary | ICD-10-CM | POA: Diagnosis not present

## 2018-06-22 DIAGNOSIS — M170 Bilateral primary osteoarthritis of knee: Secondary | ICD-10-CM | POA: Diagnosis not present

## 2018-07-15 ENCOUNTER — Encounter: Payer: Self-pay | Admitting: Internal Medicine

## 2018-07-29 DIAGNOSIS — F410 Panic disorder [episodic paroxysmal anxiety] without agoraphobia: Secondary | ICD-10-CM | POA: Diagnosis not present

## 2018-07-29 DIAGNOSIS — I10 Essential (primary) hypertension: Secondary | ICD-10-CM | POA: Diagnosis not present

## 2018-07-29 DIAGNOSIS — Z0001 Encounter for general adult medical examination with abnormal findings: Secondary | ICD-10-CM | POA: Diagnosis not present

## 2018-07-29 DIAGNOSIS — Z1331 Encounter for screening for depression: Secondary | ICD-10-CM | POA: Diagnosis not present

## 2018-07-29 DIAGNOSIS — Z0000 Encounter for general adult medical examination without abnormal findings: Secondary | ICD-10-CM | POA: Diagnosis not present

## 2018-07-29 DIAGNOSIS — D649 Anemia, unspecified: Secondary | ICD-10-CM | POA: Diagnosis not present

## 2018-07-29 DIAGNOSIS — E785 Hyperlipidemia, unspecified: Secondary | ICD-10-CM | POA: Diagnosis not present

## 2018-07-29 DIAGNOSIS — M170 Bilateral primary osteoarthritis of knee: Secondary | ICD-10-CM | POA: Diagnosis not present

## 2018-07-29 DIAGNOSIS — Z23 Encounter for immunization: Secondary | ICD-10-CM | POA: Diagnosis not present

## 2018-07-29 DIAGNOSIS — Z1389 Encounter for screening for other disorder: Secondary | ICD-10-CM | POA: Diagnosis not present

## 2018-07-29 DIAGNOSIS — N183 Chronic kidney disease, stage 3 (moderate): Secondary | ICD-10-CM | POA: Diagnosis not present

## 2018-07-29 DIAGNOSIS — M1990 Unspecified osteoarthritis, unspecified site: Secondary | ICD-10-CM | POA: Diagnosis not present

## 2018-07-29 DIAGNOSIS — R809 Proteinuria, unspecified: Secondary | ICD-10-CM | POA: Diagnosis not present

## 2018-08-06 DIAGNOSIS — B351 Tinea unguium: Secondary | ICD-10-CM | POA: Diagnosis not present

## 2018-08-06 DIAGNOSIS — I739 Peripheral vascular disease, unspecified: Secondary | ICD-10-CM | POA: Diagnosis not present

## 2018-08-06 DIAGNOSIS — L851 Acquired keratosis [keratoderma] palmaris et plantaris: Secondary | ICD-10-CM | POA: Diagnosis not present

## 2018-09-23 DIAGNOSIS — M170 Bilateral primary osteoarthritis of knee: Secondary | ICD-10-CM | POA: Diagnosis not present

## 2018-10-15 DIAGNOSIS — L851 Acquired keratosis [keratoderma] palmaris et plantaris: Secondary | ICD-10-CM | POA: Diagnosis not present

## 2018-10-15 DIAGNOSIS — I739 Peripheral vascular disease, unspecified: Secondary | ICD-10-CM | POA: Diagnosis not present

## 2018-10-15 DIAGNOSIS — B351 Tinea unguium: Secondary | ICD-10-CM | POA: Diagnosis not present

## 2018-10-28 DIAGNOSIS — E785 Hyperlipidemia, unspecified: Secondary | ICD-10-CM | POA: Diagnosis not present

## 2018-10-28 DIAGNOSIS — N183 Chronic kidney disease, stage 3 (moderate): Secondary | ICD-10-CM | POA: Diagnosis not present

## 2018-10-28 DIAGNOSIS — I10 Essential (primary) hypertension: Secondary | ICD-10-CM | POA: Diagnosis not present

## 2018-10-28 DIAGNOSIS — M170 Bilateral primary osteoarthritis of knee: Secondary | ICD-10-CM | POA: Diagnosis not present

## 2018-11-24 DIAGNOSIS — J40 Bronchitis, not specified as acute or chronic: Secondary | ICD-10-CM | POA: Diagnosis not present

## 2018-11-24 DIAGNOSIS — R05 Cough: Secondary | ICD-10-CM | POA: Diagnosis not present

## 2018-12-01 DIAGNOSIS — J209 Acute bronchitis, unspecified: Secondary | ICD-10-CM | POA: Diagnosis not present

## 2018-12-01 DIAGNOSIS — I10 Essential (primary) hypertension: Secondary | ICD-10-CM | POA: Diagnosis not present

## 2018-12-28 DIAGNOSIS — I739 Peripheral vascular disease, unspecified: Secondary | ICD-10-CM | POA: Diagnosis not present

## 2018-12-28 DIAGNOSIS — B351 Tinea unguium: Secondary | ICD-10-CM | POA: Diagnosis not present

## 2018-12-28 DIAGNOSIS — L851 Acquired keratosis [keratoderma] palmaris et plantaris: Secondary | ICD-10-CM | POA: Diagnosis not present

## 2019-01-03 DIAGNOSIS — H10232 Serous conjunctivitis, except viral, left eye: Secondary | ICD-10-CM | POA: Diagnosis not present

## 2019-01-03 DIAGNOSIS — M170 Bilateral primary osteoarthritis of knee: Secondary | ICD-10-CM | POA: Diagnosis not present

## 2019-02-07 DIAGNOSIS — M170 Bilateral primary osteoarthritis of knee: Secondary | ICD-10-CM | POA: Diagnosis not present

## 2019-03-02 DIAGNOSIS — N183 Chronic kidney disease, stage 3 (moderate): Secondary | ICD-10-CM | POA: Diagnosis not present

## 2019-03-02 DIAGNOSIS — M170 Bilateral primary osteoarthritis of knee: Secondary | ICD-10-CM | POA: Diagnosis not present

## 2019-03-02 DIAGNOSIS — E785 Hyperlipidemia, unspecified: Secondary | ICD-10-CM | POA: Diagnosis not present

## 2019-03-02 DIAGNOSIS — D649 Anemia, unspecified: Secondary | ICD-10-CM | POA: Diagnosis not present

## 2019-03-08 DIAGNOSIS — L851 Acquired keratosis [keratoderma] palmaris et plantaris: Secondary | ICD-10-CM | POA: Diagnosis not present

## 2019-03-08 DIAGNOSIS — B351 Tinea unguium: Secondary | ICD-10-CM | POA: Diagnosis not present

## 2019-03-08 DIAGNOSIS — I739 Peripheral vascular disease, unspecified: Secondary | ICD-10-CM | POA: Diagnosis not present

## 2019-04-07 DIAGNOSIS — N183 Chronic kidney disease, stage 3 (moderate): Secondary | ICD-10-CM | POA: Diagnosis not present

## 2019-04-07 DIAGNOSIS — I10 Essential (primary) hypertension: Secondary | ICD-10-CM | POA: Diagnosis not present

## 2019-04-07 DIAGNOSIS — M170 Bilateral primary osteoarthritis of knee: Secondary | ICD-10-CM | POA: Diagnosis not present

## 2019-04-07 DIAGNOSIS — D649 Anemia, unspecified: Secondary | ICD-10-CM | POA: Diagnosis not present

## 2019-05-10 DIAGNOSIS — M170 Bilateral primary osteoarthritis of knee: Secondary | ICD-10-CM | POA: Diagnosis not present

## 2019-06-03 DIAGNOSIS — L851 Acquired keratosis [keratoderma] palmaris et plantaris: Secondary | ICD-10-CM | POA: Diagnosis not present

## 2019-06-03 DIAGNOSIS — B351 Tinea unguium: Secondary | ICD-10-CM | POA: Diagnosis not present

## 2019-06-03 DIAGNOSIS — I739 Peripheral vascular disease, unspecified: Secondary | ICD-10-CM | POA: Diagnosis not present

## 2019-06-07 ENCOUNTER — Other Ambulatory Visit: Payer: Self-pay

## 2019-06-07 ENCOUNTER — Emergency Department (HOSPITAL_COMMUNITY)
Admission: EM | Admit: 2019-06-07 | Discharge: 2019-07-07 | Disposition: E | Payer: Medicare Other | Attending: Emergency Medicine | Admitting: Emergency Medicine

## 2019-06-07 ENCOUNTER — Encounter (HOSPITAL_COMMUNITY): Payer: Self-pay

## 2019-06-07 ENCOUNTER — Emergency Department (HOSPITAL_COMMUNITY): Payer: Medicare Other

## 2019-06-07 DIAGNOSIS — Z8673 Personal history of transient ischemic attack (TIA), and cerebral infarction without residual deficits: Secondary | ICD-10-CM | POA: Insufficient documentation

## 2019-06-07 DIAGNOSIS — Z7982 Long term (current) use of aspirin: Secondary | ICD-10-CM | POA: Insufficient documentation

## 2019-06-07 DIAGNOSIS — Z87891 Personal history of nicotine dependence: Secondary | ICD-10-CM | POA: Diagnosis not present

## 2019-06-07 DIAGNOSIS — N189 Chronic kidney disease, unspecified: Secondary | ICD-10-CM | POA: Insufficient documentation

## 2019-06-07 DIAGNOSIS — K6389 Other specified diseases of intestine: Secondary | ICD-10-CM | POA: Diagnosis not present

## 2019-06-07 DIAGNOSIS — I713 Abdominal aortic aneurysm, ruptured, unspecified: Secondary | ICD-10-CM

## 2019-06-07 DIAGNOSIS — I469 Cardiac arrest, cause unspecified: Secondary | ICD-10-CM | POA: Diagnosis not present

## 2019-06-07 DIAGNOSIS — I129 Hypertensive chronic kidney disease with stage 1 through stage 4 chronic kidney disease, or unspecified chronic kidney disease: Secondary | ICD-10-CM | POA: Diagnosis not present

## 2019-06-07 DIAGNOSIS — R1031 Right lower quadrant pain: Secondary | ICD-10-CM | POA: Diagnosis present

## 2019-06-07 DIAGNOSIS — D649 Anemia, unspecified: Secondary | ICD-10-CM | POA: Diagnosis not present

## 2019-06-07 LAB — CBC WITH DIFFERENTIAL/PLATELET
Abs Immature Granulocytes: 0.02 10*3/uL (ref 0.00–0.07)
Basophils Absolute: 0 10*3/uL (ref 0.0–0.1)
Basophils Relative: 0 %
Eosinophils Absolute: 0 10*3/uL (ref 0.0–0.5)
Eosinophils Relative: 1 %
HCT: 28.9 % — ABNORMAL LOW (ref 36.0–46.0)
Hemoglobin: 8.9 g/dL — ABNORMAL LOW (ref 12.0–15.0)
Immature Granulocytes: 0 %
Lymphocytes Relative: 26 %
Lymphs Abs: 1.3 10*3/uL (ref 0.7–4.0)
MCH: 31.9 pg (ref 26.0–34.0)
MCHC: 30.8 g/dL (ref 30.0–36.0)
MCV: 103.6 fL — ABNORMAL HIGH (ref 80.0–100.0)
Monocytes Absolute: 0.4 10*3/uL (ref 0.1–1.0)
Monocytes Relative: 7 %
Neutro Abs: 3.4 10*3/uL (ref 1.7–7.7)
Neutrophils Relative %: 66 %
Platelets: 160 10*3/uL (ref 150–400)
RBC: 2.79 MIL/uL — ABNORMAL LOW (ref 3.87–5.11)
RDW: 13.9 % (ref 11.5–15.5)
WBC: 5.2 10*3/uL (ref 4.0–10.5)
nRBC: 0 % (ref 0.0–0.2)

## 2019-06-07 LAB — URINALYSIS, ROUTINE W REFLEX MICROSCOPIC
Bilirubin Urine: NEGATIVE
Glucose, UA: NEGATIVE mg/dL
Hgb urine dipstick: NEGATIVE
Ketones, ur: NEGATIVE mg/dL
Nitrite: NEGATIVE
Protein, ur: >=300 mg/dL — AB
Specific Gravity, Urine: 1.013 (ref 1.005–1.030)
pH: 7 (ref 5.0–8.0)

## 2019-06-07 LAB — COMPREHENSIVE METABOLIC PANEL
ALT: 15 U/L (ref 0–44)
AST: 26 U/L (ref 15–41)
Albumin: 3.2 g/dL — ABNORMAL LOW (ref 3.5–5.0)
Alkaline Phosphatase: 183 U/L — ABNORMAL HIGH (ref 38–126)
Anion gap: 10 (ref 5–15)
BUN: 37 mg/dL — ABNORMAL HIGH (ref 8–23)
CO2: 20 mmol/L — ABNORMAL LOW (ref 22–32)
Calcium: 8.9 mg/dL (ref 8.9–10.3)
Chloride: 112 mmol/L — ABNORMAL HIGH (ref 98–111)
Creatinine, Ser: 1.78 mg/dL — ABNORMAL HIGH (ref 0.44–1.00)
GFR calc Af Amer: 28 mL/min — ABNORMAL LOW (ref >60)
GFR calc non Af Amer: 24 mL/min — ABNORMAL LOW (ref >60)
Glucose, Bld: 191 mg/dL — ABNORMAL HIGH (ref 70–99)
Potassium: 3.6 mmol/L (ref 3.5–5.1)
Sodium: 142 mmol/L (ref 135–145)
Total Bilirubin: 0.6 mg/dL (ref 0.3–1.2)
Total Protein: 5.8 g/dL — ABNORMAL LOW (ref 6.5–8.1)

## 2019-06-07 LAB — LIPASE, BLOOD: Lipase: 39 U/L (ref 11–51)

## 2019-06-07 LAB — CBG MONITORING, ED: Glucose-Capillary: 129 mg/dL — ABNORMAL HIGH (ref 70–99)

## 2019-06-07 LAB — LACTATE DEHYDROGENASE: LDH: 195 U/L — ABNORMAL HIGH (ref 98–192)

## 2019-06-07 MED ORDER — ONDANSETRON HCL 4 MG/2ML IJ SOLN
4.0000 mg | Freq: Once | INTRAMUSCULAR | Status: AC
  Administered 2019-06-07: 11:00:00 4 mg via INTRAVENOUS
  Filled 2019-06-07: qty 2

## 2019-06-07 MED ORDER — MORPHINE SULFATE (PF) 2 MG/ML IV SOLN
2.0000 mg | Freq: Once | INTRAVENOUS | Status: AC
  Administered 2019-06-07: 11:00:00 2 mg via INTRAVENOUS
  Filled 2019-06-07: qty 1

## 2019-06-07 MED ORDER — SODIUM CHLORIDE 0.9 % IV BOLUS
1000.0000 mL | Freq: Once | INTRAVENOUS | Status: AC
  Administered 2019-06-07: 11:00:00 1000 mL via INTRAVENOUS

## 2019-06-10 MED FILL — Medication: Qty: 1 | Status: AC

## 2019-07-07 NOTE — ED Notes (Signed)
IO inserted left leg @1418 

## 2019-07-07 NOTE — ED Notes (Addendum)
[facility] notified. Patient does not qualify for donation per [employer].

## 2019-07-07 NOTE — ED Notes (Signed)
Second epi @1422 

## 2019-07-07 NOTE — ED Provider Notes (Signed)
[facility] EMERGENCY DEPARTMENT Provider Note   CSN: [id number] Arrival date & time: 07/07/19  1036     History   Chief Complaint Chief Complaint  Patient presents with  . Abdominal Pain    HPI [name] is a 85 y.o. female.  Presented to the emergency department with abdominal pain.  Patient is a poor historian, unable to provide specifics.  Per her niece who was in the room, the patient woke up complaining of right lower abdominal pain.  She has never complained but this kind of pain before.  The niece notes that the patient was sick and may have vomited a small amount this morning.  It is unclear when the patient had a last bowel movement, although the niece says that she frequently has difficulties with constipation.  The patient herself is unable to answer the question.  The patient cannot qualify account of pain she is feeling or whether she is ever felt the before or whether it radiates.  She reports only that it is significant pain and she needs pain medicine.  Per her niece the patient is on tramadol 50 mg twice daily at home.  She did not take her pain medicine today.    HPI  Past Medical History:  Diagnosis Date  . Arthropathy, unspecified, site unspecified   . Edema   . Hyperlipidemia   . Hyponatremia   . Leukopenia   . Memory loss   . Other and unspecified hyperlipidemia   . Proteinuria   . S/P colonoscopy Jan 2012   anal papilla, internal hemorrhoids, melanosis coli  . Stroke ([place])   . Unspecified asthma, with exacerbation   . Unspecified essential hypertension     Patient Active Problem List   Diagnosis Date Noted  . Aspirin long-term use 07/31/2016  . Edema 01/13/2016  . Iron deficiency anemia 01/13/2016  . CKD (chronic kidney disease) 12/12/2015  . Cigarette smoker 12/12/2015  . TIA (transient ischemic attack) s/p tPA 12/09/2015  . Stroke (cerebrum) ([place]) 12/09/2015  . OA (osteoarthritis) of knee 09/18/2011  . Alkaline phosphatase elevation  05/19/2011  . Rhabdomyolysis 04/12/2011  . E-coli UTI 04/12/2011  . Headache(784.0) 04/12/2011  . SPONDYLOSIS 04/03/2010  . BACK PAIN 04/03/2010  . ULCER OF LOWER LIMB, UNSPECIFIED 11/08/2008  . Osteoarthrosis involving lower leg 02/01/2008  . OSTEOPOROSIS 12/28/2007  . ALLERGIC RHINITIS, SEASONAL 12/14/2007  . Memory loss 11/26/2007  . Hyperlipidemia LDL goal <70 07/12/2007  . Essential hypertension 06/14/2007  . ASTHMA NOS W/ACUTE EXACERBATION 06/14/2007  . ARTHRITIS 06/14/2007  . PROTEINURIA 06/14/2007    Past Surgical History:  Procedure Laterality Date  . COLONOSCOPY  11/05/2010   Dr. Gala Romney: Anal papilla and internal hemorrhoids pigmented rectal mucosa consistent with melanosis coli/ Diffuse pigmentation of the colonic mucosa consistent with melanosis coli, localized cecal diverticula as described above  . ESOPHAGOGASTRODUODENOSCOPY  06/16/2011   Dr. Oneida Alar: moderate gastritis, no h.pylori  . None       OB History    Gravida  0   Para  0   Term  0   Preterm  0   AB  0   Living  0     SAB  0   TAB  0   Ectopic  0   Multiple  0   Live Births               Home Medications    Prior to Admission medications   Medication Sig Start Date End Date Taking? Authorizing  Provider  acetaminophen (TYLENOL) 500 MG tablet Take 1,000 mg by mouth 2 (two) times daily as needed.   Yes [provider]  albuterol (VENTOLIN HFA) 108 (90 Base) MCG/ACT inhaler Inhale 2 puffs into the lungs 4 (four) times daily as needed. 12/01/18  Yes [provider]  amLODipine (NORVASC) 10 MG tablet Take 10 mg by mouth daily.    Yes Cassandria Anger, MD  aspirin EC 81 MG EC tablet Take 1 tablet (81 mg total) by mouth daily. 12/12/15  Yes Donzetta Starch, NP  atorvastatin (LIPITOR) 10 MG tablet Take 1 tablet (10 mg total) by mouth daily at 6 PM. Patient taking differently: Take 10 mg by mouth daily.  12/12/15  Yes Donzetta Starch, NP  colesevelam (WELCHOL) 625 MG tablet  Take 1 tablet by mouth daily. 05/03/19  Yes [provider]  diclofenac sodium (VOLTAREN) 1 % GEL Apply 2 g topically 4 (four) times daily as needed. 11/30/18 02/07/20 Yes [provider]  ferrous sulfate 325 (65 FE) MG tablet Take 325 mg by mouth 2 (two) times daily with a meal.   Yes [provider]  ipratropium (ATROVENT) 0.06 % nasal spray Place 2 sprays into both nostrils 3 (three) times daily as needed. 11/24/18 11/24/19 Yes [provider]  lisinopril-hydrochlorothiazide (ZESTORETIC) 20-12.5 MG tablet Take 1 tablet by mouth daily. 05/01/19  Yes [provider]  pantoprazole (PROTONIX) 40 MG tablet Take 1 tablet by mouth daily. 03/21/19  Yes [provider]  ramipril (ALTACE) 10 MG capsule Take 10 mg by mouth daily. 04/15/11  Yes Nida, Marella Chimes, MD  traMADol (ULTRAM) 50 MG tablet Take 50 mg by mouth 2 (two) times daily.   Yes [provider]    Family History Family History  Problem Relation Age of Onset  . Heart disease Other   . Arthritis Other   . Colon cancer Neg Hx     Social History Social History   Tobacco Use  . Smoking status: Former Smoker    Types: Cigarettes  . Smokeless tobacco: Never Used  Substance Use Topics  . Alcohol use: No  . Drug use: No     Allergies   Patient has no known allergies.   Review of Systems Review of Systems  Unable to perform ROS: Dementia     Physical Exam Updated Vital Signs BP 129/75   Pulse 69   Temp (!) 97.1 F (36.2 C) (Temporal)   Resp 16   Ht 5\' 5"  (1.651 m)   Wt 63.5 kg   SpO2 99%   BMI 23.30 kg/m   Physical Exam Vitals signs and nursing note reviewed.  Constitutional:      Appearance: She is well-developed.  HENT:     Head: Normocephalic and atraumatic.  Eyes:     Conjunctiva/sclera: Conjunctivae normal.  Neck:     Musculoskeletal: Neck supple.  Cardiovascular:     Rate and Rhythm: Normal rate and regular rhythm.  Pulmonary:     Effort:  Pulmonary effort is normal. No respiratory distress.  Abdominal:     General: Abdomen is flat.     Palpations: Abdomen is soft.     Tenderness: There is abdominal tenderness in the right lower quadrant. There is no guarding or rebound.     Hernia: No hernia is present.  Skin:    General: Skin is warm and dry.  Neurological:     Mental Status: She is alert.      ED Treatments /  Results  Labs (all labs ordered are listed, but only abnormal results are displayed) Labs Reviewed  CBC WITH DIFFERENTIAL/PLATELET - Abnormal; Notable for the following components:      Result Value   RBC 2.79 (*)    Hemoglobin 8.9 (*)    HCT 28.9 (*)    MCV 103.6 (*)    All other components within normal limits  COMPREHENSIVE METABOLIC PANEL - Abnormal; Notable for the following components:   Chloride 112 (*)    CO2 20 (*)    Glucose, Bld 191 (*)    BUN 37 (*)    Creatinine, Ser 1.78 (*)    Total Protein 5.8 (*)    Albumin 3.2 (*)    Alkaline Phosphatase 183 (*)    GFR calc non Af Amer 24 (*)    GFR calc Af Amer 28 (*)    All other components within normal limits  URINALYSIS, ROUTINE W REFLEX MICROSCOPIC - Abnormal; Notable for the following components:   APPearance CLOUDY (*)    Protein, ur >=300 (*)    Leukocytes,Ua TRACE (*)    Bacteria, UA MANY (*)    All other components within normal limits  LACTATE DEHYDROGENASE - Abnormal; Notable for the following components:   LDH 195 (*)    All other components within normal limits  CBG MONITORING, ED - Abnormal; Notable for the following components:   Glucose-Capillary 129 (*)    All other components within normal limits  LIPASE, BLOOD    EKG None  Radiology Ct Abdomen Pelvis Wo Contrast  Result Date: 06/12/2019 CLINICAL DATA:  Abdominal pain for 1 day EXAM: CT ABDOMEN AND PELVIS WITHOUT CONTRAST TECHNIQUE: Multidetector CT imaging of the abdomen and pelvis was performed following the standard protocol without IV contrast. COMPARISON:  None.  FINDINGS: Lower chest: No acute abnormality. Decreased attenuation is noted in the cardiac blood pool consistent with anemia. Hepatobiliary: Gallbladder is decompressed. The liver is within normal limits. Pancreas: Unremarkable. No pancreatic ductal dilatation or surrounding inflammatory changes. Spleen: Normal in size without focal abnormality. Adrenals/Urinary Tract: Adrenal glands appear within normal limits. Bilateral renal cystic change is noted. No obstructive changes are seen. The right kidney is significantly anteriorly displaced by retroperitoneal hematoma. Stomach/Bowel: Colon is distended with fecal material distally. No obstructive changes are seen. The appendix is not discretely visualized. The small bowel is unremarkable. Stomach is decompressed. Vascular/Lymphatic: The abdominal aorta demonstrates significant deformity with collapse of the previously seen calcified lumen consistent with aortic rupture. Large Periaortic hematoma is noted with extension into the retroperitoneum and along the psoas muscle on the right. It measures at least 12 by 6 cm in transverse and AP dimensions and extends for a significant distance in the craniocaudad projection at least 15 cm. Differential density is noted within the retroperitoneal hematoma consistent with both acute and chronic hemorrhage. No contrast was administered for this exam. No significant lymphadenopathy is identified. Reproductive: Uterus appears to be surgically removed. Other: No pelvic mass is noted. Musculoskeletal: No acute or significant osseous findings. IMPRESSION: Massive rupture of a an abdominal aortic aneurysm with complete collapse of the calcified aortic wall. Changes of large retroperitoneal hematoma and Peri aortic hematoma are seen with varying densities consistent with both acute and chronic hemorrhage. Diffuse anemia is identified secondary to the hemorrhage. Chronic changes as described above. Critical Value/emergent results were  called by telephone at the time of interpretation on 06-12-2019 at 2:25 pm to Dr. Octaviano Glow , who verbally acknowledged these results. Electronically  Signed   By: Inez Catalina M.D.   On: 07-05-2019 14:33    Procedures .Critical Care Performed by: Wyvonnia Dusky, MD Authorized by: Wyvonnia Dusky, MD   Critical care provider statement:    Critical care time (minutes):  35   Critical care was necessary to treat or prevent imminent or life-threatening deterioration of the following conditions:  Cardiac failure and shock (PEA arrest)   Critical care was time spent personally by me on the following activities:  Discussions with consultants, evaluation of patient's response to treatment, examination of patient, ordering and performing treatments and interventions, ordering and review of laboratory studies, ordering and review of radiographic studies, pulse oximetry, re-evaluation of patient's condition, obtaining history from patient or surrogate, review of old charts and vascular access procedures   I assumed direction of critical care for this patient from another provider in my specialty: no   Comments:     Performed primary resuscitation for PEA arrest, including IO access, intubation, and IV fluids and epinephrine administration. Procedure Name: Intubation Date/Time: 2019-07-05 2:20 PM Performed by: Wyvonnia Dusky, MD Pre-anesthesia Checklist: Suction available, Patient identified and Emergency Drugs available Ventilation: Mask ventilation without difficulty Laryngoscope Size: Glidescope Grade View: Grade III Tube type: Subglottic suction tube Tube size: 7.5 mm Number of attempts: 1 Airway Equipment and Method: Stylet and Video-laryngoscopy      (including critical care time)  Medications Ordered in ED Medications  morphine 2 MG/ML injection 2 mg (2 mg Intravenous Given 05-Jul-2019 1129)  ondansetron (ZOFRAN) injection 4 mg (4 mg Intravenous Given 07-05-2019 1129)  sodium chloride  0.9 % bolus 1,000 mL (0 mLs Intravenous Stopped 2019-07-05 1428)     Initial Impression / Assessment and Plan / ED Course  I have reviewed the triage vital signs and the nursing notes.  Pertinent labs & imaging results that were available during my care of the patient were reviewed by me and considered in my medical decision making (see chart for details).  85 year old female with past history of TIA, presenting to the emergency department with abdominal pain.  Patient has some level of hearing difficulty and baseline dementia per her niece.  The patient ports 10 out of 10 pain and is unable to answer any further questions for me. She has a  benign abdominal exam.  It appears that her pain began at some point early this morning.  Her niece believes that there may be ongoing issues with constipation for which the patient is being treated.  DDx includes constipation, appendicitis, diverticulitis, UTI, and renal calculus. Less likely cholecystitis or pancreatitis given the location of her symptoms.  No pulsatile midline mass to suggest AAA.  Given her history of thrombosis and occlusion, she is also at high risk for mesenteric ischemia.  We will obtain a lactate awaiting blood test.  We will check her kidney function prior to obtaining a CT image.  She does appear mildly dehydrated on exam.  We will give her 2 mg of morphine and 1 L of IV fluid.   Clinical Course as of Jun 07 1599  Tue Jun 07, 2019  1216 No significant elevation in LDH to suggest ischemic bowel.   [MT]  [id number] Pt appears improved after morphine, no longer writhing, but still reporting unchanged pain.  She is unable to tell us about her last BM and her niece has been unable ot determine this.  Suspect possibility of constipation as cause but given her age I believe it is reasonable  to obtain a NON contrast scan (due to Cr and GFR) to evaluate for kidney stone vs. Infectious causes.     [MT]  [id number] The patient coded shortly after returning  from the CT scan.  Her initial rhythm was PEA arrest.  Based on her clinical history I was highly concerned for ruptured aortic aneurysm.  This was later confirmed by the radiologist to call me during the code to report that it appears that the patient has a ruptured AAA based on her calcification pattern.  Despite aggressive fluid resuscitation and multiple rounds of epinephrine, we are unable to resuscitate the patient.  She was declared deceased at 80.  Her niece Carmelina Peal was present at bedside, and confirmed that she is the patient's next of kin.   [MT]  1525 I spoke to the patient's PCP Dr. Legrand Rams, and informed him of the patient's death.   [MT]    Clinical Course User Index [MT] Renel Ende, Carola Rhine, MD       Final Clinical Impressions(s) / ED Diagnoses   Final diagnoses:  None    ED Discharge Orders    None       Bentli Llorente, Carola Rhine, MD 06/26/19 1600

## 2019-07-07 NOTE — ED Notes (Signed)
Time of death called 36.

## 2019-07-07 NOTE — ED Notes (Addendum)
Pt IV infiltrated before going to CT. Removed IV and placed pt on monitor. Pt transported to CT. Upon arrival back to ED, took a student in to start additional IV. Pt monitor not connected. Pt unresponsive. No pulse or respirations. CPR started on pt, code called. Spoke with CT transport who stated pt's visitor told CT that pt was "sleeping".

## 2019-07-07 NOTE — ED Notes (Signed)
Epi pushed 1419

## 2019-07-07 NOTE — ED Notes (Signed)
Charged at 200, shocked @1417 

## 2019-07-07 NOTE — ED Triage Notes (Signed)
Pt stated today she started having abdominal pain and feeling sick all over. Per niece, pt is normally active and today she hasn't felt like doing anything.

## 2019-07-07 NOTE — ED Notes (Signed)
CPR stopped

## 2019-07-07 NOTE — ED Notes (Signed)
Pt was informed we need urine sample. 

## 2019-07-07 NOTE — ED Notes (Signed)
CPR started at 1414

## 2019-07-07 DEATH — deceased
